# Patient Record
Sex: Female | Born: 1935 | Race: White | Hispanic: No | Marital: Married | Smoking: Never smoker
Health system: Southern US, Community
[De-identification: ages and names within clinical notes are randomized; demographics above are authoritative.]

## PROBLEM LIST (undated history)

## (undated) DIAGNOSIS — I10 Essential (primary) hypertension: Secondary | ICD-10-CM

## (undated) DIAGNOSIS — I509 Heart failure, unspecified: Secondary | ICD-10-CM

## (undated) HISTORY — PX: BACK SURGERY: SHX140

## (undated) HISTORY — PX: INSERT / REPLACE / REMOVE PACEMAKER: SUR710

## (undated) HISTORY — PX: TONSILLECTOMY: SUR1361

## (undated) HISTORY — PX: CHOLECYSTECTOMY: SHX55

## (undated) HISTORY — PX: EXCISION VAGINAL CYST: SHX5825

## (undated) HISTORY — PX: ABDOMINAL HYSTERECTOMY: SHX81

---

## 2020-03-25 ENCOUNTER — Encounter (HOSPITAL_COMMUNITY): Payer: Self-pay | Admitting: *Deleted

## 2020-03-25 ENCOUNTER — Emergency Department (HOSPITAL_COMMUNITY): Payer: Medicare Other

## 2020-03-25 ENCOUNTER — Other Ambulatory Visit: Payer: Self-pay

## 2020-03-25 ENCOUNTER — Inpatient Hospital Stay (HOSPITAL_COMMUNITY)
Admission: EM | Admit: 2020-03-25 | Discharge: 2020-03-28 | DRG: 603 | Disposition: A | Discharge status: Home Health | Payer: Medicare Other | Attending: Internal Medicine | Admitting: Internal Medicine

## 2020-03-25 DIAGNOSIS — Z7982 Long term (current) use of aspirin: Secondary | ICD-10-CM

## 2020-03-25 DIAGNOSIS — N184 Chronic kidney disease, stage 4 (severe): Secondary | ICD-10-CM | POA: Diagnosis present

## 2020-03-25 DIAGNOSIS — L02511 Cutaneous abscess of right hand: Secondary | ICD-10-CM | POA: Diagnosis present

## 2020-03-25 DIAGNOSIS — Z66 Do not resuscitate: Secondary | ICD-10-CM | POA: Diagnosis present

## 2020-03-25 DIAGNOSIS — R55 Syncope and collapse: Secondary | ICD-10-CM | POA: Diagnosis present

## 2020-03-25 DIAGNOSIS — W19XXXA Unspecified fall, initial encounter: Secondary | ICD-10-CM | POA: Diagnosis present

## 2020-03-25 DIAGNOSIS — I10 Essential (primary) hypertension: Secondary | ICD-10-CM

## 2020-03-25 DIAGNOSIS — I130 Hypertensive heart and chronic kidney disease with heart failure and stage 1 through stage 4 chronic kidney disease, or unspecified chronic kidney disease: Secondary | ICD-10-CM | POA: Diagnosis present

## 2020-03-25 DIAGNOSIS — Z950 Presence of cardiac pacemaker: Secondary | ICD-10-CM

## 2020-03-25 DIAGNOSIS — Z79890 Hormone replacement therapy (postmenopausal): Secondary | ICD-10-CM

## 2020-03-25 DIAGNOSIS — Y92009 Unspecified place in unspecified non-institutional (private) residence as the place of occurrence of the external cause: Secondary | ICD-10-CM

## 2020-03-25 DIAGNOSIS — L03119 Cellulitis of unspecified part of limb: Secondary | ICD-10-CM | POA: Diagnosis present

## 2020-03-25 DIAGNOSIS — R531 Weakness: Secondary | ICD-10-CM | POA: Diagnosis present

## 2020-03-25 DIAGNOSIS — L02519 Cutaneous abscess of unspecified hand: Secondary | ICD-10-CM | POA: Diagnosis present

## 2020-03-25 DIAGNOSIS — M659 Synovitis and tenosynovitis, unspecified: Secondary | ICD-10-CM | POA: Diagnosis present

## 2020-03-25 DIAGNOSIS — I509 Heart failure, unspecified: Secondary | ICD-10-CM | POA: Diagnosis present

## 2020-03-25 DIAGNOSIS — E782 Mixed hyperlipidemia: Secondary | ICD-10-CM | POA: Diagnosis present

## 2020-03-25 DIAGNOSIS — N289 Disorder of kidney and ureter, unspecified: Secondary | ICD-10-CM

## 2020-03-25 DIAGNOSIS — Z809 Family history of malignant neoplasm, unspecified: Secondary | ICD-10-CM

## 2020-03-25 DIAGNOSIS — Z833 Family history of diabetes mellitus: Secondary | ICD-10-CM

## 2020-03-25 DIAGNOSIS — Z7952 Long term (current) use of systemic steroids: Secondary | ICD-10-CM

## 2020-03-25 DIAGNOSIS — I480 Paroxysmal atrial fibrillation: Secondary | ICD-10-CM | POA: Diagnosis present

## 2020-03-25 DIAGNOSIS — I5032 Chronic diastolic (congestive) heart failure: Secondary | ICD-10-CM | POA: Diagnosis present

## 2020-03-25 DIAGNOSIS — L03011 Cellulitis of right finger: Secondary | ICD-10-CM | POA: Diagnosis not present

## 2020-03-25 DIAGNOSIS — E039 Hypothyroidism, unspecified: Secondary | ICD-10-CM | POA: Diagnosis present

## 2020-03-25 DIAGNOSIS — Z888 Allergy status to other drugs, medicaments and biological substances status: Secondary | ICD-10-CM

## 2020-03-25 DIAGNOSIS — Z882 Allergy status to sulfonamides status: Secondary | ICD-10-CM

## 2020-03-25 DIAGNOSIS — I776 Arteritis, unspecified: Secondary | ICD-10-CM

## 2020-03-25 DIAGNOSIS — R42 Dizziness and giddiness: Secondary | ICD-10-CM | POA: Diagnosis present

## 2020-03-25 DIAGNOSIS — Z20822 Contact with and (suspected) exposure to covid-19: Secondary | ICD-10-CM | POA: Diagnosis present

## 2020-03-25 DIAGNOSIS — S0990XA Unspecified injury of head, initial encounter: Secondary | ICD-10-CM | POA: Diagnosis present

## 2020-03-25 HISTORY — DX: Heart failure, unspecified: I50.9

## 2020-03-25 HISTORY — DX: Essential (primary) hypertension: I10

## 2020-03-25 LAB — COMPREHENSIVE METABOLIC PANEL
ALT: 19 U/L (ref 0–44)
AST: 28 U/L (ref 15–41)
Albumin: 3.4 g/dL — ABNORMAL LOW (ref 3.5–5.0)
Alkaline Phosphatase: 75 U/L (ref 38–126)
Anion gap: 15 (ref 5–15)
BUN: 58 mg/dL — ABNORMAL HIGH (ref 8–23)
CO2: 21 mmol/L — ABNORMAL LOW (ref 22–32)
Calcium: 11.3 mg/dL — ABNORMAL HIGH (ref 8.9–10.3)
Chloride: 99 mmol/L (ref 98–111)
Creatinine, Ser: 2.06 mg/dL — ABNORMAL HIGH (ref 0.44–1.00)
GFR, Estimated: 23 mL/min — ABNORMAL LOW (ref >60)
Glucose, Bld: 104 mg/dL — ABNORMAL HIGH (ref 70–99)
Potassium: 4.2 mmol/L (ref 3.5–5.1)
Sodium: 135 mmol/L (ref 135–145)
Total Bilirubin: 0.7 mg/dL (ref 0.3–1.2)
Total Protein: 7.1 g/dL (ref 6.5–8.1)

## 2020-03-25 LAB — URINALYSIS, ROUTINE W REFLEX MICROSCOPIC
Bacteria, UA: NONE SEEN
Bilirubin Urine: NEGATIVE
Glucose, UA: NEGATIVE mg/dL
Hgb urine dipstick: NEGATIVE
Ketones, ur: NEGATIVE mg/dL
Nitrite: NEGATIVE
Protein, ur: NEGATIVE mg/dL
Specific Gravity, Urine: 1.016 (ref 1.005–1.030)
pH: 6 (ref 5.0–8.0)

## 2020-03-25 LAB — CBC WITH DIFFERENTIAL/PLATELET
Abs Immature Granulocytes: 0.07 10*3/uL (ref 0.00–0.07)
Basophils Absolute: 0 10*3/uL (ref 0.0–0.1)
Basophils Relative: 0 %
Eosinophils Absolute: 0.1 10*3/uL (ref 0.0–0.5)
Eosinophils Relative: 0 %
HCT: 39.8 % (ref 36.0–46.0)
Hemoglobin: 12.9 g/dL (ref 12.0–15.0)
Immature Granulocytes: 1 %
Lymphocytes Relative: 19 %
Lymphs Abs: 2.1 10*3/uL (ref 0.7–4.0)
MCH: 31 pg (ref 26.0–34.0)
MCHC: 32.4 g/dL (ref 30.0–36.0)
MCV: 95.7 fL (ref 80.0–100.0)
Monocytes Absolute: 1 10*3/uL (ref 0.1–1.0)
Monocytes Relative: 9 %
Neutro Abs: 8 10*3/uL — ABNORMAL HIGH (ref 1.7–7.7)
Neutrophils Relative %: 71 %
Platelets: 307 10*3/uL (ref 150–400)
RBC: 4.16 MIL/uL (ref 3.87–5.11)
RDW: 13.4 % (ref 11.5–15.5)
WBC: 11.2 10*3/uL — ABNORMAL HIGH (ref 4.0–10.5)
nRBC: 0 % (ref 0.0–0.2)

## 2020-03-25 LAB — LACTIC ACID, PLASMA: Lactic Acid, Venous: 1.6 mmol/L (ref 0.5–1.9)

## 2020-03-25 LAB — PROTIME-INR
INR: 1 (ref 0.8–1.2)
Prothrombin Time: 13 seconds (ref 11.4–15.2)

## 2020-03-25 NOTE — ED Provider Notes (Addendum)
[facility] EMERGENCY DEPARTMENT Provider Note   CSN: [id number] Arrival date & time: 03/25/20  2133   History Chief Complaint  Patient presents with  . index finger red swollen    Carol Brewer is a 85 y.o. female.  The history is provided by the patient.  She has history of hypertension, heart failure and comes in because of pain and swelling of her right index finger.  This has been present for about 4 or 5 days and has been getting worse.  She does not recall any kind of injury, but she is a very poor historian in general.  She denies fever or chills.  She rates pain at 7/10.  Pain is worse with movement or accidentally hitting it.  There has been no drainage.  Past Medical History:  Diagnosis Date  . CHF (congestive heart failure) ([place])   . Hypertension     There are no problems to display for this patient.   History reviewed. No pertinent surgical history.   OB History   No obstetric history on file.     No family history on file.  Social History   Tobacco Use  . Smoking status: Never Smoker  . Smokeless tobacco: Never Used  Substance Use Topics  . Alcohol use: Never  . Drug use: Not on file    Home Medications Prior to Admission medications   Not on File    Allergies    Penicillins  Review of Systems   Review of Systems  All other systems reviewed and are negative.   Physical Exam Updated Vital Signs BP (!) 142/88   Pulse 98   Temp 97.9 F (36.6 C)   Resp 17   Ht 5\' 6"  (1.676 m)   Wt 72.6 kg   SpO2 98%   BMI 25.82 kg/m   Physical Exam Vitals and nursing note reviewed.   85 year old female, resting comfortably and in no acute distress. Vital signs are significant for mildly elevated blood pressure. Oxygen saturation is 98%, which is normal. Head is normocephalic and atraumatic. PERRLA, EOMI. Oropharynx is clear. Neck is nontender and supple without adenopathy or JVD. Back is nontender and there is no CVA  tenderness. Lungs are clear without rales, wheezes, or rhonchi. Chest is nontender. Heart has regular rate and rhythm without murmur. Abdomen is soft, flat, nontender without masses or hepatosplenomegaly and peristalsis is normoactive. Extremities: Moderate erythema and swelling of the entire right index finger with some slight fluctuance noted in the distal phalanx.  There is pain with passive range of motion, but the finger is not completely stiff.  Capillary refill is prompt.  Remainder of extremity exam is significant for evidence of Heberden's nodes. Skin is warm and dry without rash. Neurologic: Mental status is normal, cranial nerves are intact, there are no motor or sensory deficits.    ED Results / Procedures / Treatments   Labs (all labs ordered are listed, but only abnormal results are displayed) Labs Reviewed  COMPREHENSIVE METABOLIC PANEL - Abnormal; Notable for the following components:      Result Value   CO2 21 (*)    Glucose, Bld 104 (*)    BUN 58 (*)    Creatinine, Ser 2.06 (*)    Calcium 11.3 (*)    Albumin 3.4 (*)    GFR, Estimated 23 (*)    All other components within normal limits  CBC WITH DIFFERENTIAL/PLATELET - Abnormal; Notable for the following components:   WBC 11.2 (*)  Neutro Abs 8.0 (*)    All other components within normal limits  URINALYSIS, ROUTINE W REFLEX MICROSCOPIC - Abnormal; Notable for the following components:   APPearance HAZY (*)    Leukocytes,Ua TRACE (*)    All other components within normal limits  CULTURE, BLOOD (ROUTINE X 2)  CULTURE, BLOOD (ROUTINE X 2)  LACTIC ACID, PLASMA  PROTIME-INR  LACTIC ACID, PLASMA   Radiology DG Chest 2 View  Result Date: 03/25/2020 CLINICAL DATA:  Suspected sepsis.  Atrial fibrillation history. EXAM: CHEST - 2 VIEW COMPARISON:  None. FINDINGS: Two lead left chest wall pacemaker. The heart size and mediastinal contours are within normal limits. 4 mm right upper lobe calcified nodule likely  represents of calcified granuloma. Suggestion of a retrocardiac opacity on the frontal view not visualized on lateral view - likely due to overlying soft tissues. No focal consolidation. No pulmonary edema. No pleural effusion. No pneumothorax. No acute osseous abnormality. Multilevel degenerative changes of the spine. L1 through L3 compression fractures with kyphoplasty at the L1 level. Surgical clips in the upper abdomen. IMPRESSION: 1. No active cardiopulmonary disease. 2. L1 through L3 compression fractures -likely chronic. Electronically Signed   By: Iven Finn M.D.   On: 03/25/2020 22:57   DG Finger Index Right  Result Date: 03/26/2020 CLINICAL DATA:  Initial evaluation for acute swelling. EXAM: RIGHT INDEX FINGER 2+V COMPARISON:  None. FINDINGS: No acute fracture dislocation. Diffuse soft tissue swelling present about the right index finger. Minimal heterotopic calcification noted adjacent to the right second middle phalanx. No radiopaque foreign body. No abnormal or dissecting soft tissue emphysema. No evidence for acute osteomyelitis. 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 IMPRESSION: 1. Diffuse soft tissue swelling about the right index finger. No evidence for acute osteomyelitis. 2. No acute fracture or dislocation. 3. 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 Electronically Signed   By: Jeannine Boga M.D.   On: 03/26/2020 01:06    Procedures Procedures   Medications Ordered in ED Medications  vancomycin (VANCOCIN) IVPB 1000 mg/200 mL premix (1,000 mg Intravenous New Bag/Given 03/26/20 0121)    ED Course  I have reviewed the triage vital signs and the nursing notes.  Pertinent labs & imaging results that were available during my care of the patient were reviewed by me and considered in my medical decision making (see chart for details).  MDM Rules/Calculators/A&P Infection of  right index finger.  I suspect this started as a paronychia but with swelling and redness of the entire finger, I am concerned about early tenosynovitis.  Patient was started on sepsis pathway at triage and lactic acid level is normal, WBC only minimally elevated without a left shift.  She does have renal insufficiency and hypercalcemia with no prior labs available for comparison.  Chest x-ray shows no evidence of pneumonia.  Old records are reviewed, and she has no relevant past visits.  Will consult hand surgery regarding possible exploration of finger.  Case has been discussed with Dr. Jeannie Fend who has reviewed the pictures and has requested an x-ray be obtained and start patient on antibiotics.  He request patient be admitted to medicine service and he will see in consultation and decide whether she need surgical drainage based on response to therapy.  Case is discussed with Dr. Cyd Silence of [facility], who agrees to admit the patient.  Finger x-ray shows no evidence of osteomyelitis.  Final Clinical Impression(s) / ED Diagnoses Final diagnoses:  Tenosynovitis of finger  Elevated blood pressure reading with diagnosis of hypertension  Renal insufficiency  Hypercalcemia    Rx / DC Orders ED Discharge Orders    None       Delora Fuel, MD 38/18/40 [id number]    Delora Fuel, MD 36/06/77 0202

## 2020-03-25 NOTE — ED Triage Notes (Signed)
The pt has had a red swollen rt indes=x finger painful for 2-3 days today the finger is red swollen and has pread to the hand she denies any injury no cut she has it bandaged

## 2020-03-26 ENCOUNTER — Observation Stay (HOSPITAL_COMMUNITY): Payer: Medicare Other

## 2020-03-26 ENCOUNTER — Encounter (HOSPITAL_COMMUNITY)
Admission: EM | Disposition: A | Discharge status: Home Health | Payer: Self-pay | Source: Home / Self Care | Attending: Internal Medicine

## 2020-03-26 ENCOUNTER — Emergency Department (HOSPITAL_COMMUNITY): Payer: Medicare Other

## 2020-03-26 ENCOUNTER — Observation Stay (HOSPITAL_COMMUNITY): Payer: Medicare Other | Admitting: Certified Registered"

## 2020-03-26 ENCOUNTER — Encounter (HOSPITAL_COMMUNITY): Payer: Self-pay | Admitting: Internal Medicine

## 2020-03-26 DIAGNOSIS — E039 Hypothyroidism, unspecified: Secondary | ICD-10-CM

## 2020-03-26 DIAGNOSIS — M659 Synovitis and tenosynovitis, unspecified: Secondary | ICD-10-CM | POA: Diagnosis present

## 2020-03-26 DIAGNOSIS — Z809 Family history of malignant neoplasm, unspecified: Secondary | ICD-10-CM | POA: Diagnosis not present

## 2020-03-26 DIAGNOSIS — Z882 Allergy status to sulfonamides status: Secondary | ICD-10-CM | POA: Diagnosis not present

## 2020-03-26 DIAGNOSIS — R55 Syncope and collapse: Secondary | ICD-10-CM

## 2020-03-26 DIAGNOSIS — I776 Arteritis, unspecified: Secondary | ICD-10-CM | POA: Diagnosis present

## 2020-03-26 DIAGNOSIS — L02511 Cutaneous abscess of right hand: Secondary | ICD-10-CM | POA: Diagnosis present

## 2020-03-26 DIAGNOSIS — Z7952 Long term (current) use of systemic steroids: Secondary | ICD-10-CM | POA: Diagnosis not present

## 2020-03-26 DIAGNOSIS — I480 Paroxysmal atrial fibrillation: Secondary | ICD-10-CM

## 2020-03-26 DIAGNOSIS — I130 Hypertensive heart and chronic kidney disease with heart failure and stage 1 through stage 4 chronic kidney disease, or unspecified chronic kidney disease: Secondary | ICD-10-CM | POA: Diagnosis present

## 2020-03-26 DIAGNOSIS — N184 Chronic kidney disease, stage 4 (severe): Secondary | ICD-10-CM | POA: Diagnosis present

## 2020-03-26 DIAGNOSIS — I10 Essential (primary) hypertension: Secondary | ICD-10-CM

## 2020-03-26 DIAGNOSIS — Z7982 Long term (current) use of aspirin: Secondary | ICD-10-CM | POA: Diagnosis not present

## 2020-03-26 DIAGNOSIS — Z20822 Contact with and (suspected) exposure to covid-19: Secondary | ICD-10-CM | POA: Diagnosis present

## 2020-03-26 DIAGNOSIS — I509 Heart failure, unspecified: Secondary | ICD-10-CM | POA: Diagnosis present

## 2020-03-26 DIAGNOSIS — R531 Weakness: Secondary | ICD-10-CM | POA: Diagnosis present

## 2020-03-26 DIAGNOSIS — Y92009 Unspecified place in unspecified non-institutional (private) residence as the place of occurrence of the external cause: Secondary | ICD-10-CM

## 2020-03-26 DIAGNOSIS — Z950 Presence of cardiac pacemaker: Secondary | ICD-10-CM

## 2020-03-26 DIAGNOSIS — Z79890 Hormone replacement therapy (postmenopausal): Secondary | ICD-10-CM | POA: Diagnosis not present

## 2020-03-26 DIAGNOSIS — E782 Mixed hyperlipidemia: Secondary | ICD-10-CM

## 2020-03-26 DIAGNOSIS — I5032 Chronic diastolic (congestive) heart failure: Secondary | ICD-10-CM | POA: Diagnosis present

## 2020-03-26 DIAGNOSIS — Z66 Do not resuscitate: Secondary | ICD-10-CM | POA: Diagnosis present

## 2020-03-26 DIAGNOSIS — Z888 Allergy status to other drugs, medicaments and biological substances status: Secondary | ICD-10-CM | POA: Diagnosis not present

## 2020-03-26 DIAGNOSIS — Z833 Family history of diabetes mellitus: Secondary | ICD-10-CM | POA: Diagnosis not present

## 2020-03-26 DIAGNOSIS — W19XXXA Unspecified fall, initial encounter: Secondary | ICD-10-CM | POA: Diagnosis present

## 2020-03-26 DIAGNOSIS — L02519 Cutaneous abscess of unspecified hand: Secondary | ICD-10-CM | POA: Diagnosis present

## 2020-03-26 DIAGNOSIS — L03011 Cellulitis of right finger: Secondary | ICD-10-CM | POA: Diagnosis present

## 2020-03-26 HISTORY — DX: Arteritis, unspecified: I77.6

## 2020-03-26 HISTORY — DX: Essential (primary) hypertension: I10

## 2020-03-26 HISTORY — DX: Presence of cardiac pacemaker: Z95.0

## 2020-03-26 HISTORY — PX: I & D EXTREMITY: SHX5045

## 2020-03-26 HISTORY — DX: Hypothyroidism, unspecified: E03.9

## 2020-03-26 HISTORY — DX: Mixed hyperlipidemia: E78.2

## 2020-03-26 LAB — COMPREHENSIVE METABOLIC PANEL
ALT: 17 U/L (ref 0–44)
AST: 23 U/L (ref 15–41)
Albumin: 2.9 g/dL — ABNORMAL LOW (ref 3.5–5.0)
Alkaline Phosphatase: 73 U/L (ref 38–126)
Anion gap: 13 (ref 5–15)
BUN: 53 mg/dL — ABNORMAL HIGH (ref 8–23)
CO2: 21 mmol/L — ABNORMAL LOW (ref 22–32)
Calcium: 10.4 mg/dL — ABNORMAL HIGH (ref 8.9–10.3)
Chloride: 101 mmol/L (ref 98–111)
Creatinine, Ser: 1.89 mg/dL — ABNORMAL HIGH (ref 0.44–1.00)
GFR, Estimated: 26 mL/min — ABNORMAL LOW (ref >60)
Glucose, Bld: 86 mg/dL (ref 70–99)
Potassium: 3.8 mmol/L (ref 3.5–5.1)
Sodium: 135 mmol/L (ref 135–145)
Total Bilirubin: 0.9 mg/dL (ref 0.3–1.2)
Total Protein: 6.2 g/dL — ABNORMAL LOW (ref 6.5–8.1)

## 2020-03-26 LAB — RESP PANEL BY RT-PCR (FLU A&B, COVID) ARPGX2
Influenza A by PCR: NEGATIVE
Influenza B by PCR: NEGATIVE
SARS Coronavirus 2 by RT PCR: NEGATIVE

## 2020-03-26 LAB — ECHOCARDIOGRAM COMPLETE
AR max vel: 3.09 cm2
AV Area VTI: 3.24 cm2
AV Area mean vel: 2.78 cm2
AV Mean grad: 4 mmHg
AV Peak grad: 5.9 mmHg
Ao pk vel: 1.21 m/s
Area-P 1/2: 2.05 cm2
Height: 66 in
P 1/2 time: 713 msec
S' Lateral: 2.6 cm
Weight: 2560 oz

## 2020-03-26 LAB — CBC WITH DIFFERENTIAL/PLATELET
Abs Immature Granulocytes: 0.05 10*3/uL (ref 0.00–0.07)
Basophils Absolute: 0 10*3/uL (ref 0.0–0.1)
Basophils Relative: 0 %
Eosinophils Absolute: 0 10*3/uL (ref 0.0–0.5)
Eosinophils Relative: 0 %
HCT: 37 % (ref 36.0–46.0)
Hemoglobin: 12.1 g/dL (ref 12.0–15.0)
Immature Granulocytes: 1 %
Lymphocytes Relative: 12 %
Lymphs Abs: 1.2 10*3/uL (ref 0.7–4.0)
MCH: 31.3 pg (ref 26.0–34.0)
MCHC: 32.7 g/dL (ref 30.0–36.0)
MCV: 95.6 fL (ref 80.0–100.0)
Monocytes Absolute: 1 10*3/uL (ref 0.1–1.0)
Monocytes Relative: 10 %
Neutro Abs: 8.1 10*3/uL — ABNORMAL HIGH (ref 1.7–7.7)
Neutrophils Relative %: 77 %
Platelets: 261 10*3/uL (ref 150–400)
RBC: 3.87 MIL/uL (ref 3.87–5.11)
RDW: 13.6 % (ref 11.5–15.5)
WBC: 10.4 10*3/uL (ref 4.0–10.5)
nRBC: 0 % (ref 0.0–0.2)

## 2020-03-26 LAB — MAGNESIUM: Magnesium: 2.1 mg/dL (ref 1.7–2.4)

## 2020-03-26 LAB — TSH: TSH: 0.804 u[IU]/mL (ref 0.350–4.500)

## 2020-03-26 LAB — TROPONIN I (HIGH SENSITIVITY): Troponin I (High Sensitivity): 45 ng/L — ABNORMAL HIGH (ref <18)

## 2020-03-26 LAB — LACTIC ACID, PLASMA: Lactic Acid, Venous: 1.3 mmol/L (ref 0.5–1.9)

## 2020-03-26 LAB — MRSA PCR SCREENING: MRSA by PCR: NEGATIVE

## 2020-03-26 SURGERY — IRRIGATION AND DEBRIDEMENT EXTREMITY
Anesthesia: General | Site: Finger | Laterality: Right

## 2020-03-26 MED ORDER — CHLORHEXIDINE GLUCONATE 0.12 % MT SOLN: 15.0000 mL | Freq: Once | OROMUCOSAL | Status: AC

## 2020-03-26 MED ORDER — LIDOCAINE HCL (CARDIAC) PF 100 MG/5ML IV SOSY
PREFILLED_SYRINGE | INTRAVENOUS | Status: DC | PRN
  Administered 2020-03-26: 20 mg via INTRAVENOUS

## 2020-03-26 MED ORDER — BUPIVACAINE HCL (PF) 0.25 % IJ SOLN
INTRAMUSCULAR | Status: AC
  Filled 2020-03-26: qty 30

## 2020-03-26 MED ORDER — ACETAMINOPHEN 325 MG PO TABS
650.0000 mg | ORAL_TABLET | Freq: Four times a day (QID) | ORAL | Status: DC | PRN
  Administered 2020-03-26 – 2020-03-28 (×4): 650 mg via ORAL
  Filled 2020-03-26 (×4): qty 2

## 2020-03-26 MED ORDER — METOCLOPRAMIDE HCL 5 MG/ML IJ SOLN: 5.0000 mg | Freq: Three times a day (TID) | INTRAMUSCULAR | Status: DC | PRN

## 2020-03-26 MED ORDER — FENTANYL CITRATE (PF) 250 MCG/5ML IJ SOLN
INTRAMUSCULAR | Status: AC
  Filled 2020-03-26: qty 5

## 2020-03-26 MED ORDER — ONDANSETRON HCL 4 MG/2ML IJ SOLN
INTRAMUSCULAR | Status: DC | PRN
  Administered 2020-03-26: 4 mg via INTRAVENOUS

## 2020-03-26 MED ORDER — CEFAZOLIN SODIUM-DEXTROSE 2-3 GM-%(50ML) IV SOLR
INTRAVENOUS | Status: DC | PRN
  Administered 2020-03-26: 2 g via INTRAVENOUS

## 2020-03-26 MED ORDER — LIDOCAINE HCL (PF) 2 % IJ SOLN
INTRAMUSCULAR | Status: AC
  Filled 2020-03-26: qty 5

## 2020-03-26 MED ORDER — SODIUM CHLORIDE 0.9 % IV SOLN: INTRAVENOUS | Status: AC

## 2020-03-26 MED ORDER — ENSURE ENLIVE PO LIQD
237.0000 mL | Freq: Two times a day (BID) | ORAL | Status: DC
  Administered 2020-03-26 – 2020-03-28 (×4): 237 mL via ORAL

## 2020-03-26 MED ORDER — DEXAMETHASONE SODIUM PHOSPHATE 4 MG/ML IJ SOLN
INTRAMUSCULAR | Status: DC | PRN
  Administered 2020-03-26: 4 mg via INTRAVENOUS

## 2020-03-26 MED ORDER — POLYETHYLENE GLYCOL 3350 17 G PO PACK: 17.0000 g | PACK | Freq: Every day | ORAL | Status: DC | PRN

## 2020-03-26 MED ORDER — HYDRALAZINE HCL 20 MG/ML IJ SOLN: 10.0000 mg | Freq: Four times a day (QID) | INTRAMUSCULAR | Status: DC | PRN

## 2020-03-26 MED ORDER — FENTANYL CITRATE (PF) 100 MCG/2ML IJ SOLN
INTRAMUSCULAR | Status: DC | PRN
  Administered 2020-03-26: 50 ug via INTRAVENOUS

## 2020-03-26 MED ORDER — ONDANSETRON HCL 4 MG/2ML IJ SOLN: 4.0000 mg | Freq: Four times a day (QID) | INTRAMUSCULAR | Status: DC | PRN

## 2020-03-26 MED ORDER — VANCOMYCIN HCL IN DEXTROSE 1-5 GM/200ML-% IV SOLN
1000.0000 mg | Freq: Once | INTRAVENOUS | Status: AC
  Administered 2020-03-26: 1000 mg via INTRAVENOUS
  Filled 2020-03-26: qty 200

## 2020-03-26 MED ORDER — PROPOFOL 10 MG/ML IV BOLUS
INTRAVENOUS | Status: AC
  Filled 2020-03-26: qty 20

## 2020-03-26 MED ORDER — LEVOTHYROXINE SODIUM 112 MCG PO TABS
112.0000 ug | ORAL_TABLET | Freq: Every day | ORAL | Status: DC
  Administered 2020-03-27 – 2020-03-28 (×2): 112 ug via ORAL
  Filled 2020-03-26 (×2): qty 1

## 2020-03-26 MED ORDER — GEMFIBROZIL 600 MG PO TABS
600.0000 mg | ORAL_TABLET | Freq: Every day | ORAL | Status: DC
  Administered 2020-03-26 – 2020-03-28 (×3): 600 mg via ORAL
  Filled 2020-03-26 (×3): qty 1

## 2020-03-26 MED ORDER — SODIUM CHLORIDE 0.9 % IR SOLN
Status: DC | PRN
  Administered 2020-03-26: 1000 mL

## 2020-03-26 MED ORDER — PREDNISONE 10 MG PO TABS
10.0000 mg | ORAL_TABLET | Freq: Every day | ORAL | Status: DC
  Administered 2020-03-26 – 2020-03-28 (×3): 10 mg via ORAL
  Filled 2020-03-26 (×3): qty 1

## 2020-03-26 MED ORDER — DOCUSATE SODIUM 100 MG PO CAPS
100.0000 mg | ORAL_CAPSULE | Freq: Two times a day (BID) | ORAL | Status: DC
  Administered 2020-03-26 – 2020-03-28 (×3): 100 mg via ORAL
  Filled 2020-03-26 (×4): qty 1

## 2020-03-26 MED ORDER — BUPIVACAINE HCL (PF) 0.25 % IJ SOLN
INTRAMUSCULAR | Status: DC | PRN
  Administered 2020-03-26: 4 mL

## 2020-03-26 MED ORDER — CHLORHEXIDINE GLUCONATE 0.12 % MT SOLN
OROMUCOSAL | Status: AC
  Administered 2020-03-26: 15 mL via OROMUCOSAL
  Filled 2020-03-26: qty 15

## 2020-03-26 MED ORDER — CEFAZOLIN SODIUM-DEXTROSE 2-4 GM/100ML-% IV SOLN
INTRAVENOUS | Status: AC
  Filled 2020-03-26: qty 100

## 2020-03-26 MED ORDER — ACETAMINOPHEN 650 MG RE SUPP: 650.0000 mg | Freq: Four times a day (QID) | RECTAL | Status: DC | PRN

## 2020-03-26 MED ORDER — OXYCODONE HCL 5 MG/5ML PO SOLN: 5.0000 mg | Freq: Once | ORAL | Status: DC | PRN

## 2020-03-26 MED ORDER — PROPOFOL 10 MG/ML IV BOLUS
INTRAVENOUS | Status: DC | PRN
  Administered 2020-03-26: 20 mg via INTRAVENOUS
  Administered 2020-03-26: 100 mg via INTRAVENOUS

## 2020-03-26 MED ORDER — METOCLOPRAMIDE HCL 5 MG PO TABS: 5.0000 mg | ORAL_TABLET | Freq: Three times a day (TID) | ORAL | Status: DC | PRN

## 2020-03-26 MED ORDER — OXYCODONE HCL 5 MG PO TABS: 5.0000 mg | ORAL_TABLET | Freq: Once | ORAL | Status: DC | PRN

## 2020-03-26 MED ORDER — AMIODARONE HCL 100 MG PO TABS
50.0000 mg | ORAL_TABLET | Freq: Every day | ORAL | Status: DC
  Administered 2020-03-26 – 2020-03-28 (×3): 50 mg via ORAL
  Filled 2020-03-26 (×3): qty 1

## 2020-03-26 MED ORDER — SODIUM CHLORIDE 0.9 % IV SOLN: INTRAVENOUS | Status: DC

## 2020-03-26 MED ORDER — AMLODIPINE BESYLATE 5 MG PO TABS
5.0000 mg | ORAL_TABLET | Freq: Every day | ORAL | Status: DC
  Administered 2020-03-26 – 2020-03-28 (×3): 5 mg via ORAL
  Filled 2020-03-26 (×3): qty 1

## 2020-03-26 MED ORDER — PHENYLEPHRINE HCL (PRESSORS) 10 MG/ML IV SOLN
INTRAVENOUS | Status: DC | PRN
  Administered 2020-03-26: 80 ug via INTRAVENOUS

## 2020-03-26 MED ORDER — FENTANYL CITRATE (PF) 100 MCG/2ML IJ SOLN: 25.0000 ug | INTRAMUSCULAR | Status: DC | PRN

## 2020-03-26 MED ORDER — VANCOMYCIN HCL 750 MG/150ML IV SOLN
750.0000 mg | INTRAVENOUS | Status: DC
  Administered 2020-03-26: 750 mg via INTRAVENOUS
  Filled 2020-03-26: qty 150

## 2020-03-26 MED ORDER — ORAL CARE MOUTH RINSE: 15.0000 mL | Freq: Once | OROMUCOSAL | Status: AC

## 2020-03-26 MED ORDER — ONDANSETRON HCL 4 MG PO TABS: 4.0000 mg | ORAL_TABLET | Freq: Four times a day (QID) | ORAL | Status: DC | PRN

## 2020-03-26 SURGICAL SUPPLY — 65 items
ALCOHOL 70% 16 OZ (MISCELLANEOUS) ×2 IMPLANT
BANDAGE CONFORM 2  STR LF (GAUZE/BANDAGES/DRESSINGS) ×2 IMPLANT
BLADE SURG 10 STRL SS (BLADE) ×2 IMPLANT
BNDG COHESIVE 1X5 TAN STRL LF (GAUZE/BANDAGES/DRESSINGS) ×2 IMPLANT
BNDG COHESIVE 4X5 TAN STRL (GAUZE/BANDAGES/DRESSINGS) ×2 IMPLANT
BNDG CONFORM 3 STRL LF (GAUZE/BANDAGES/DRESSINGS) IMPLANT
BNDG ELASTIC 3X5.8 VLCR STR LF (GAUZE/BANDAGES/DRESSINGS) IMPLANT
BNDG GAUZE ELAST 4 BULKY (GAUZE/BANDAGES/DRESSINGS) ×6 IMPLANT
CORD BIPOLAR FORCEPS 12FT (ELECTRODE) IMPLANT
COVER SURGICAL LIGHT HANDLE (MISCELLANEOUS) ×2 IMPLANT
COVER WAND RF STERILE (DRAPES) ×2 IMPLANT
CUFF TOURN SGL QUICK 24 (TOURNIQUET CUFF)
CUFF TOURN SGL QUICK 34 (TOURNIQUET CUFF) ×2
CUFF TOURN SGL QUICK 42 (TOURNIQUET CUFF) IMPLANT
CUFF TRNQT CYL 24X4X16.5-23 (TOURNIQUET CUFF) IMPLANT
CUFF TRNQT CYL 34X4.125X (TOURNIQUET CUFF) ×2 IMPLANT
DRAPE IMP U-DRAPE 54X76 (DRAPES) IMPLANT
DRAPE SURG 17X23 STRL (DRAPES) IMPLANT
DRAPE U-SHAPE 47X51 STRL (DRAPES) ×2 IMPLANT
DRSG PAD ABDOMINAL 8X10 ST (GAUZE/BANDAGES/DRESSINGS) ×2 IMPLANT
DURAPREP 26ML APPLICATOR (WOUND CARE) ×2 IMPLANT
ELECT CAUTERY BLADE 6.4 (BLADE) ×2 IMPLANT
ELECT REM PT RETURN 9FT ADLT (ELECTROSURGICAL)
ELECTRODE REM PT RTRN 9FT ADLT (ELECTROSURGICAL) IMPLANT
FACESHIELD WRAPAROUND (MASK) IMPLANT
GAUZE PACKING IODOFORM 1/4X15 (PACKING) ×2 IMPLANT
GAUZE SPONGE 4X4 12PLY STRL (GAUZE/BANDAGES/DRESSINGS) ×2 IMPLANT
GAUZE XEROFORM 1X8 LF (GAUZE/BANDAGES/DRESSINGS) ×2 IMPLANT
GAUZE XEROFORM 5X9 LF (GAUZE/BANDAGES/DRESSINGS) ×2 IMPLANT
GLOVE BIO SURGEON STRL SZ7.5 (GLOVE) ×4 IMPLANT
GLOVE BIOGEL PI IND STRL 8 (GLOVE) ×2 IMPLANT
GLOVE BIOGEL PI INDICATOR 8 (GLOVE) ×2
GOWN STRL REUS W/ TWL LRG LVL3 (GOWN DISPOSABLE) ×2 IMPLANT
GOWN STRL REUS W/ TWL XL LVL3 (GOWN DISPOSABLE) ×2 IMPLANT
GOWN STRL REUS W/TWL LRG LVL3 (GOWN DISPOSABLE) ×2
GOWN STRL REUS W/TWL XL LVL3 (GOWN DISPOSABLE) ×2
HANDPIECE INTERPULSE COAX TIP (DISPOSABLE)
KIT BASIN OR (CUSTOM PROCEDURE TRAY) ×2 IMPLANT
KIT TURNOVER KIT B (KITS) ×2 IMPLANT
MANIFOLD NEPTUNE II (INSTRUMENTS) ×2 IMPLANT
NS IRRIG 1000ML POUR BTL (IV SOLUTION) ×2 IMPLANT
PACK ORTHO EXTREMITY (CUSTOM PROCEDURE TRAY) ×2 IMPLANT
PAD ARMBOARD 7.5X6 YLW CONV (MISCELLANEOUS) ×4 IMPLANT
PADDING CAST ABS 4INX4YD NS (CAST SUPPLIES) ×2
PADDING CAST ABS COTTON 4X4 ST (CAST SUPPLIES) ×2 IMPLANT
PADDING CAST COTTON 6X4 STRL (CAST SUPPLIES) ×2 IMPLANT
SET CYSTO W/LG BORE CLAMP LF (SET/KITS/TRAYS/PACK) ×2 IMPLANT
SET HNDPC FAN SPRY TIP SCT (DISPOSABLE) IMPLANT
SPONGE LAP 18X18 RF (DISPOSABLE) ×4 IMPLANT
STOCKINETTE IMPERVIOUS 9X36 MD (GAUZE/BANDAGES/DRESSINGS) ×2 IMPLANT
SUT ETHILON 2 0 FS 18 (SUTURE) IMPLANT
SUT ETHILON 2 0 PSLX (SUTURE) IMPLANT
SUT ETHILON 3 0 PS 1 (SUTURE) IMPLANT
SUT VIC AB 2-0 CT1 36 (SUTURE) IMPLANT
SUT VIC AB 2-0 FS1 27 (SUTURE) IMPLANT
SWAB COLLECTION DEVICE MRSA (MISCELLANEOUS) ×2 IMPLANT
SWAB CULTURE ESWAB REG 1ML (MISCELLANEOUS) ×2 IMPLANT
SYR CONTROL 10ML LL (SYRINGE) IMPLANT
TOWEL GREEN STERILE (TOWEL DISPOSABLE) ×2 IMPLANT
TOWEL GREEN STERILE FF (TOWEL DISPOSABLE) ×2 IMPLANT
TUBE CONNECTING 12X1/4 (SUCTIONS) ×2 IMPLANT
TUBE FEEDING ENTERAL 5FR 16IN (TUBING) IMPLANT
UNDERPAD 30X36 HEAVY ABSORB (UNDERPADS AND DIAPERS) ×4 IMPLANT
WATER STERILE IRR 1000ML POUR (IV SOLUTION) ×2 IMPLANT
YANKAUER SUCT BULB TIP NO VENT (SUCTIONS) ×2 IMPLANT

## 2020-03-26 NOTE — Consult Note (Signed)
ORTHOPAEDIC CONSULTATION  REQUESTING PHYSICIAN: Swayze, Ava, DO  PCP:  Jacqualine Code, DO  Chief Complaint: right index finger infection  HPI: Carol Brewer is a 85 y.o. female who complains of right hand index finger swelling and increasing pain with redness.  She is right-hand dominant.  She is also independent with activities of daily living.  She endorses about 5 days of gradually increasing swelling and redness and pain.  She was evaluated in the emergency department and found to have signs concerning for progression of a soft tissue infection of the finger.  X-rays were normal.  She does have obvious cellulitis and likely felon on exam.  Hand surgery was consulted for treatment options.  She is had IV vancomycin at this point.  She denies numbness or tingling.  Past Medical History:  Diagnosis Date  . CHF (congestive heart failure) ([place])   . Essential hypertension 03/26/2020  . History of pacemaker 03/26/2020  . Hypertension   . Hypothyroidism 03/26/2020  . Mixed hyperlipidemia 03/26/2020  . Vasculitis ([place]) 03/26/2020   Past Surgical History:  Procedure Laterality Date  . ABDOMINAL HYSTERECTOMY    . BACK SURGERY    . CHOLECYSTECTOMY    . EXCISION VAGINAL CYST    . INSERT / REPLACE / REMOVE PACEMAKER    . TONSILLECTOMY     Social History   Socioeconomic History  . Marital status: Married    Spouse name: Not on file  . Number of children: Not on file  . Years of education: Not on file  . Highest education level: Not on file  Occupational History  . Not on file  Tobacco Use  . Smoking status: Never Smoker  . Smokeless tobacco: Never Used  Vaping Use  . Vaping Use: Never used  Substance and Sexual Activity  . Alcohol use: Never  . Drug use: Not on file  . Sexual activity: Not on file  Other Topics Concern  . Not on file  Social History Narrative  . Not on file   Social Determinants of Health   Financial Resource Strain:   . Difficulty of Paying  Living Expenses: Not on file  Food Insecurity:   . Worried About Charity fundraiser in the Last Year: Not on file  . Ran Out of Food in the Last Year: Not on file  Transportation Needs:   . Lack of Transportation (Medical): Not on file  . Lack of Transportation (Non-Medical): Not on file  Physical Activity:   . Days of Exercise per Week: Not on file  . Minutes of Exercise per Session: Not on file  Stress:   . Feeling of Stress : Not on file  Social Connections:   . Frequency of Communication with Friends and Family: Not on file  . Frequency of Social Gatherings with Friends and Family: Not on file  . Attends Religious Services: Not on file  . Active Member of Clubs or Organizations: Not on file  . Attends Archivist Meetings: Not on file  . Marital Status: Not on file   Family History  Problem Relation Age of Onset  . Diabetes Mother   . Bone cancer Father    Allergies  Allergen Reactions  . Lisinopril Swelling  . Sulfa Antibiotics Hives   Prior to Admission medications   Medication Sig Start Date End Date Taking? Authorizing Provider  amiodarone (PACERONE) 200 MG tablet Take 100 mg by mouth daily. 03/21/20  Yes [provider]  amLODipine (NORVASC) 5  MG tablet Take 5 mg by mouth daily. 01/19/20  Yes [provider]  aspirin 325 MG tablet Take 162.5 mg by mouth daily.    Yes [provider]  cetirizine (ZYRTEC) 10 MG tablet Take 10 mg by mouth daily as needed for allergies.   Yes [provider]  gemfibrozil (LOPID) 600 MG tablet Take 600 mg by mouth at bedtime. 01/18/20  Yes [provider]  KRILL OIL PO Take 1 Dose by mouth daily.   Yes [provider]  levothyroxine (SYNTHROID) 112 MCG tablet Take 112 mcg by mouth daily. 12/31/19  Yes [provider]  Magnesium Oxide 500 MG TABS Take 500 mg by mouth at bedtime. 01/01/20  Yes [provider]  Multiple Vitamin (MULTIVITAMIN) tablet Take 1 tablet by  mouth daily.   Yes [provider]  Omega-3 Fatty Acids (FISH OIL PO) Take 1 Dose by mouth daily.   Yes [provider]  predniSONE (DELTASONE) 10 MG tablet Take 10 mg by mouth every evening. 12/23/19  Yes [provider]  spironolactone-hydrochlorothiazide (ALDACTAZIDE) 25-25 MG tablet Take 1 tablet by mouth daily. 02/08/20  Yes [provider]   DG Chest 2 View  Result Date: 03/25/2020 CLINICAL DATA:  Suspected sepsis.  Atrial fibrillation history. EXAM: CHEST - 2 VIEW COMPARISON:  None. FINDINGS: Two lead left chest wall pacemaker. The heart size and mediastinal contours are within normal limits. 4 mm right upper lobe calcified nodule likely represents of calcified granuloma. Suggestion of a retrocardiac opacity on the frontal view not visualized on lateral view - likely due to overlying soft tissues. No focal consolidation. No pulmonary edema. No pleural effusion. No pneumothorax. No acute osseous abnormality. Multilevel degenerative changes of the spine. L1 through L3 compression fractures with kyphoplasty at the L1 level. Surgical clips in the upper abdomen. IMPRESSION: 1. No active cardiopulmonary disease. 2. L1 through L3 compression fractures -likely chronic. Electronically Signed   By: Iven Finn M.D.   On: 03/25/2020 22:57   CT HEAD WO CONTRAST  Result Date: 03/26/2020 CLINICAL DATA:  Head trauma EXAM: CT HEAD WITHOUT CONTRAST TECHNIQUE: Contiguous axial images were obtained from the base of the skull through the vertex without intravenous contrast. COMPARISON:  None. FINDINGS: Brain: No evidence of acute infarction, hemorrhage, hydrocephalus, extra-axial collection or mass lesion/mass effect. Cerebral atrophy. Low-attenuation changes in the deep white matter consistent small vessel ischemia. Vascular: Moderate intracranial vascular calcifications. Skull: Calvarium appears intact. No acute depressed skull fractures. Sinuses/Orbits: Mucosal thickening  throughout the paranasal sinuses. Near complete opacification of the sphenoid sinus. No acute air-fluid levels. Mastoid air cells are clear. Other: None. IMPRESSION: 1. No acute intracranial abnormalities. 2. Chronic atrophy and small vessel ischemia. 3. Chronic inflammatory changes of the paranasal sinuses. Electronically Signed   By: Lucienne Capers M.D.   On: 03/26/2020 03:47   DG Finger Index Right  Result Date: 03/26/2020 CLINICAL DATA:  Initial evaluation for acute swelling. EXAM: RIGHT INDEX FINGER 2+V COMPARISON:  None. FINDINGS: No acute fracture dislocation. Diffuse soft tissue swelling present about the right index finger. Minimal heterotopic calcification noted adjacent to the right second middle phalanx. No radiopaque foreign body. No abnormal or dissecting soft tissue emphysema. No evidence for acute osteomyelitis. 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 IMPRESSION: 1. Diffuse soft tissue swelling about the right index finger. No evidence for acute osteomyelitis. 2. No acute fracture or dislocation. 3. Moderate osteoarthritic changes about the right index finger and visualized  hand. Electronically Signed   By: Jeannine Boga M.D.   On: 03/26/2020 01:06   ECHOCARDIOGRAM COMPLETE  Result Date: 03/26/2020    ECHOCARDIOGRAM REPORT   Patient Name:   SAMMY DOUTHITT Date of Exam: 03/26/2020 Medical Rec #:  [id number]     Height:       66.0 in Accession #:    [id number]    Weight:       160.0 lb Date of Birth:  1934/12/09     BSA:          1.819 m Patient Age:    12 years      BP:           123/65 mmHg Patient Gender: F             HR:           71 bpm. Exam Location:  Inpatient Procedure: 2D Echo, Cardiac Doppler and Color Doppler Indications:    Syncope  History:        Patient has no prior history of Echocardiogram examinations.                 Pacemaker, Arrythmias:Atrial Fibrillation; Risk                 Factors:Hypertension and  Dyslipidemia.  Sonographer:    Clayton Lefort RDCS (AE) Referring Phys: [id number] Sherryll Burger [facility]  Sonographer Comments: Technically challenging study due to limited acoustic windows, suboptimal parasternal window, suboptimal apical window and suboptimal subcostal window. IMPRESSIONS  1. Left ventricular ejection fraction, by estimation, is 60 to 65%. The left ventricle has normal function. The left ventricle has no regional wall motion abnormalities. There is mild left ventricular hypertrophy of the basal-septal segment. Left ventricular diastolic parameters are consistent with Grade I diastolic dysfunction (impaired relaxation).  2. Right ventricular systolic function is normal. The right ventricular size is normal.  3. The mitral valve is normal in structure. No evidence of mitral valve regurgitation.  4. The aortic valve is tricuspid. Aortic valve regurgitation is trivial. Mild aortic valve sclerosis is present, with no evidence of aortic valve stenosis.  5. There is borderline dilatation of the ascending aorta, measuring 37 mm. There is Moderate (Grade III) protruding plaque involving the transverse aorta.  6. The inferior vena cava is normal in size with greater than 50% respiratory variability, suggesting right atrial pressure of 3 mmHg. FINDINGS  Left Ventricle: Left ventricular ejection fraction, by estimation, is 60 to 65%. The left ventricle has normal function. The left ventricle has no regional wall motion abnormalities. The left ventricular internal cavity size was normal in size. There is  mild left ventricular hypertrophy of the basal-septal segment. Left ventricular diastolic parameters are consistent with Grade I diastolic dysfunction (impaired relaxation). Normal left ventricular filling pressure. Right Ventricle: The right ventricular size is normal. No increase in right ventricular wall thickness. Right ventricular systolic function is normal. Left Atrium: Left atrial size was normal in size. Right  Atrium: Right atrial size was normal in size. Pericardium: Trivial pericardial effusion is present. Mitral Valve: The mitral valve is normal in structure. No evidence of mitral valve regurgitation. Tricuspid Valve: The tricuspid valve is normal in structure. Tricuspid valve regurgitation is trivial. Aortic Valve: The aortic valve is tricuspid. Aortic valve regurgitation is trivial. Aortic regurgitation PHT measures 713 msec. Mild aortic valve sclerosis is present, with no evidence of aortic valve stenosis. Aortic valve mean gradient measures 4.0 mmHg. Aortic valve peak gradient measures 5.9 mmHg.  Aortic valve area, by VTI measures 3.24 cm. Pulmonic Valve: The pulmonic valve was grossly normal. Pulmonic valve regurgitation is not visualized. Aorta: The aortic root is normal in size and structure. There is borderline dilatation of the ascending aorta, measuring 37 mm. There is moderate (Grade III) protruding plaque involving the transverse aorta. Venous: The inferior vena cava is normal in size with greater than 50% respiratory variability, suggesting right atrial pressure of 3 mmHg. IAS/Shunts: No atrial level shunt detected by color flow Doppler. Additional Comments: A pacer wire is visualized.  LEFT VENTRICLE PLAX 2D LVIDd:         3.90 cm  Diastology LVIDs:         2.60 cm  LV e' medial:    4.00 cm/s LV PW:         1.15 cm  LV E/e' medial:  8.8 LV IVS:        1.50 cm  LV e' lateral:   7.00 cm/s LVOT diam:     2.00 cm  LV E/e' lateral: 5.0 LV SV:         63 LV SV Index:   34 LVOT Area:     3.14 cm  RIGHT VENTRICLE RV S prime:     12.60 cm/s TAPSE (M-mode): 1.6 cm LEFT ATRIUM             Index       RIGHT ATRIUM           Index LA diam:        3.20 cm 1.76 cm/m  RA Area:     11.10 cm LA Vol (A2C):   38.6 ml 21.22 ml/m RA Volume:   21.50 ml  11.82 ml/m LA Vol (A4C):   36.2 ml 19.90 ml/m LA Biplane Vol: 39.8 ml 21.88 ml/m  AORTIC VALVE AV Area (Vmax):    3.09 cm AV Area (Vmean):   2.78 cm AV Area (VTI):      3.24 cm AV Vmax:           121.00 cm/s AV Vmean:          90.400 cm/s AV VTI:            0.193 m AV Peak Grad:      5.9 mmHg AV Mean Grad:      4.0 mmHg LVOT Vmax:         119.00 cm/s LVOT Vmean:        80.000 cm/s LVOT VTI:          0.199 m LVOT/AV VTI ratio: 1.03 AI PHT:            713 msec  AORTA Ao Root diam: 3.00 cm Ao Asc diam:  3.70 cm MITRAL VALVE               TRICUSPID VALVE MV Area (PHT): 2.05 cm    TR Peak grad:   17.1 mmHg MV Decel Time: 370 msec    TR Vmax:        207.00 cm/s MV E velocity: 35.20 cm/s MV A velocity: 72.84 cm/s  SHUNTS MV E/A ratio:  0.48        Systemic VTI:  0.20 m                            Systemic Diam: 2.00 cm Dani Gobble Croitoru MD Electronically signed by Sanda Klein MD Signature Date/Time: 03/26/2020/10:45:13 AM    Final  Positive ROS: All other systems have been reviewed and were otherwise negative with the exception of those mentioned in the HPI and as above.  Physical Exam: General: Alert, no acute distress Cardiovascular: No pedal edema Respiratory: No cyanosis, no use of accessory musculature GI: No organomegaly, abdomen is soft and non-tender Skin: No lesions in the area of chief complaint Neurologic: Sensation intact distally Psychiatric: Patient is competent for consent with normal mood and affect Lymphatic: No axillary or cervical lymphadenopathy  MUSCULOSKELETAL:  Right hand demonstrates dorsal erythema to the MP joint along the index finger.  She also has an obvious felon of the DIP on the radial side.  This does seem to track just slightly proximal to the DIP joint.  Otherwise no tenderness along the flexor sheath itself or at the A1 pulley.  She has no pain along the flexor side with passive extension or active flexion.  Assessment: Septic felon right hand index finger  Plan: -Due to the progressive nature of this finger infection our recommendation at this time is to go ahead with semiurgent open debridement and irrigation for this septic  felon of the right hand index finger.  -We will send some intraoperative cultures and have her return back to the hospitalist service postoperatively for continuation of IV antibiotics for at least 24 hours.  -Carol Brewer and I reviewed the risk and benefits of the procedure including but not limited to bleeding, infection, damage to surrounding nerves and vessels, persistence of infection, need for further surgery, and stiffness.  She has provided informed consent.    Nicholes Stairs, MD Cell [phone]    03/26/2020 11:14 AM

## 2020-03-26 NOTE — Progress Notes (Signed)
PROGRESS NOTE  Carol Brewer YTK:[id number] DOB: 1934-08-20 DOA: 03/25/2020 PCP: Jacqualine Code, DO  Brief History   85 year old female with past medical history of paroxysmal atrial fibrillation status post pacemaker placement (2013), hypothyroidism, hyperlipidemia, hypertension who presents to [facility] emergency department with swelling and pain of the right index finger.  Patient explains that approximately 5 days ago she began to develop redness and swelling of the right index finger.  Initially this was mild in intensity but over the next several days became more and more severe.  This is associated with severe pain at the finger, worse with flexion of the finger or accidentally striking it on surfaces.  Denies any associated nausea, vomiting, fever, change in appetite or generalized weakness.  As the days progressed pain swelling and redness continued to worsen.  Eventually decided to present to [facility] emergency department for evaluation.  Upon further questioning, patient also reports a 1 week history of episodic lightheadedness upon rising from a seated position.  This past Wednesday, patient also reports an episode of brief loss of consciousness.  Denies tongue biting self urination or self defecation.  Son denies any seizure activity.  Upon evaluation at [facility] emergency department, she was found to have substantial cellulitis of the index severe of the right hand.  Patient has been initiated on intravenous vancomycin.  X-ray of the right index finger reveals no evidence of osteomyelitis.  The hospitalist group has been called to assess the patient for admission to the hospital.  Dr. Stann Mainland was consulted for hand surgery. The patient went to the OR for open debridement and irrigation of the index finger of the right hand.   Consultants   Orthopedic surgery  Procedures   Open debridement and irrigation of the right index  finger.  Antibiotics   Anti-infectives (From admission, onward)   Start     Dose/Rate Route Frequency Ordered Stop   03/27/20 0000  vancomycin (VANCOREADY) IVPB 750 mg/150 mL        750 mg 150 mL/hr over 60 Minutes Intravenous Every 24 hours 03/26/20 0248     03/26/20 1119  ceFAZolin (ANCEF) 2-4 GM/100ML-% IVPB       Note to Pharmacy: Bobbie Stack   : cabinet override      03/26/20 1119 03/26/20 2329   03/26/20 0045  vancomycin (VANCOCIN) IVPB 1000 mg/200 mL premix        1,000 mg 200 mL/hr over 60 Minutes Intravenous  Once 03/26/20 0035 03/26/20 0250       Subjective  The patient is sitting up at bedside. No new complaints.  Objective   Vitals:  Vitals:   03/26/20 1240 03/26/20 1347  BP: (!) 112/56 (!) 144/76  Pulse:  68  Resp:    Temp: 98.3 F (36.8 C)   SpO2:  90%   Exam:  Constitutional:   The patient is awake, alert, and oriented x 3. No acute distress. Respiratory:   No increased work of breathing.  No wheezes, rales, or rhonchi  No tactile fremitus Cardiovascular:   Regular rate and rhythm  No murmurs, ectopy, or gallups.  No lateral PMI. No thrills. Abdomen:   Abdomen is soft, non-tender, non-distended  No hernias, masses, or organomegaly  Normoactive bowel sounds.  Musculoskeletal:   No cyanosis, clubbing, or edema  Right index finger is spinted and bandaged. Skin:   No rashes, lesions, ulcers  palpation of skin: no induration or nodules Neurologic:   CN 2-12 intact  Sensation all 4 extremities intact Psychiatric:   Mental status o Mood, affect appropriate o Orientation to person, place, time   judgment and insight appear intact   I have personally reviewed the following:   Today's Data   Vitals, CMP, CBC  Micro Data   Blood cultures x 2: No growht  Wound Culture: Pending  Imaging   X-ray of the right hand.  Scheduled Meds:  amiodarone  50 mg Oral Daily   amLODipine  5 mg Oral Daily   docusate sodium   100 mg Oral BID   feeding supplement  237 mL Oral BID BM   gemfibrozil  600 mg Oral Daily   levothyroxine  112 mcg Oral Q0600   predniSONE  10 mg Oral Q breakfast   Continuous Infusions:  sodium chloride 100 mL/hr at 03/26/20 0338   ceFAZolin     [START ON 03/27/2020] vancomycin      Principal Problem:   Cellulitis of index finger, right Active Problems:   AF (paroxysmal atrial fibrillation) (HCC)   Essential hypertension   Hypothyroidism   Mixed hyperlipidemia   Vasculitis ([place])   Syncope   Fall at home, initial encounter   Cellulitis and abscess of hand   LOS: 0 days   A & P  Cellulitis of index finger, right: Pt is s/p open debridement and irrigation of the right index finger by orthopedic surgery. She is receiving IV Vancomycin. Will dc vanc and replace with ancef. Blood cultures have had no growth. No osteomyelitis on x-ray.   Syncope: Patient reports 1 week history of what sounds to be orthostatic lightheadedness in addition to one episode of likely syncope this past Wednesday.Patient reports striking her head as she fell.  CT head negative. Pt will be monitored on telemetry. EKG demonstrates age-indeterminate septal infarct. Troponin is elevated. Likely due to elevated creatinine. Echocardiogram is pending. HCTZ and spironolactone are held. Will also check orthostatics.   Fall at home, initial encounter: Frequent bouts of lightheadedness at home over the past week with one episode of what sounds to be syncope last Wednesday. Obtaining CT imaging of the head due to patient reporting striking her head as she fell. Obtaining PT evaluation.  AF (paroxysmal atrial fibrillation) ([place]): Continue home regimen of low-dose amiodarone. Patient is currently rate controlled and in normal sinus rhythm. Continue to monitor patient on telemetry.  Essential hypertension: Continue home regimen of amlodipine. Temporarily holding diuretics due to reported bouts of lightheadedness and  syncope. As needed intravenous hydralazine for markedly high blood pressures.  Hypothyroidism: Continue home regimen of Synthroid  Vasculitis ([place]): Patient reports longstanding history of "inflammation of the blood vessels" but is unable to give the diagnosis. Continue home regimen of prednisone 10 mg daily. Patient recently moved here within the past year, no further records concerning this available.  Mixed hyperlipidemia: Continue home regimen of gemfibrozil  Code Status:  DNR Family Communication: None at bedside. Status is: Observation The patient remains OBS appropriate and will d/c before 2 midnights. Dispo: The patient is from: Home  Anticipated d/c is to: Home  Anticipated d/c date is: 2 days  Patient currently is not medically stable to d/c.  Megha Agnes, DO [facility] Direct contact: see [email or web address]  7PM-7AM contact night coverage as above 03/26/2020, 4:30 PM  LOS: 0 days

## 2020-03-26 NOTE — Brief Op Note (Signed)
03/26/2020  12:02 PM  PATIENT:  Carol Brewer  85 y.o. female  PRE-OPERATIVE DIAGNOSIS:  RIGHT INDEX FINGER INFECTION  POST-OPERATIVE DIAGNOSIS:  Same  PROCEDURE:  IRRIGATION AND DEBRIDEMENT, INDEX FINGER  SURGEON:  Nicholes Stairs, MD  ANESTHESIA:   General  COMPLICATIONS: None

## 2020-03-26 NOTE — Anesthesia Preprocedure Evaluation (Addendum)
Anesthesia Evaluation  Patient identified by MRN, date of birth, ID band Patient awake    Reviewed: Allergy & Precautions, NPO status , Patient's Chart, lab work & pertinent test results  History of Anesthesia Complications Negative for: history of anesthetic complications  Airway Mallampati: I  TM Distance: >3 FB Neck ROM: Full    Dental  (+) Edentulous Upper, Edentulous Lower   Pulmonary neg pulmonary ROS,  03/26/2020 SARS coronavirus NEG   breath sounds clear to auscultation       Cardiovascular hypertension, Pt. on medications (-) angina+ dysrhythmias Atrial Fibrillation + pacemaker  Rhythm:Regular Rate:Normal  03/26/2020 ECHO: EF 60-65%. LV has no regional  wall motion abnormalities,  mild left ventricular hypertrophy of the basal-septal segment. Left  ventricular diastolic parameters are consistent with Grade I diastolic dysfunction, no significant valvular abnormalities   Neuro/Psych negative neurological ROS     GI/Hepatic negative GI ROS, Neg liver ROS,   Endo/Other  Hypothyroidism Vasculitis: steroids  Renal/GU Renal InsufficiencyRenal disease     Musculoskeletal   Abdominal   Peds  Hematology negative hematology ROS (+)   Anesthesia Other Findings   Reproductive/Obstetrics                            Anesthesia Physical Anesthesia Plan  ASA: III  Anesthesia Plan: General   Post-op Pain Management:    Induction: Intravenous  PONV Risk Score and Plan: 3 and Ondansetron, Dexamethasone and Treatment may vary due to age or medical condition  Airway Management Planned: LMA  Additional Equipment: None  Intra-op Plan:   Post-operative Plan:   Informed Consent: I have reviewed the patients History and Physical, chart, labs and discussed the procedure including the risks, benefits and alternatives for the proposed anesthesia with the patient or authorized representative who  has indicated his/her understanding and acceptance.   Patient has DNR.  Discussed DNR with patient and Suspend DNR.     Plan Discussed with: CRNA and Surgeon  Anesthesia Plan Comments:       Anesthesia Quick Evaluation

## 2020-03-26 NOTE — Anesthesia Procedure Notes (Signed)
Procedure Name: LMA Insertion Date/Time: 03/26/2020 11:33 AM Performed by: Amadeo Garnet, CRNA Pre-anesthesia Checklist: Patient identified, Emergency Drugs available, Patient being monitored and Suction available Patient Re-evaluated:Patient Re-evaluated prior to induction Oxygen Delivery Method: Circle system utilized Preoxygenation: Pre-oxygenation with 100% oxygen Induction Type: IV induction LMA: LMA inserted LMA Size: 4.0 Placement Confirmation: positive ETCO2 Dental Injury: Teeth and Oropharynx as per pre-operative assessment

## 2020-03-26 NOTE — Evaluation (Signed)
Occupational Therapy Evaluation Patient Details Name: Carol Brewer MRN: [id number] DOB: 04-22-1935 Today's Date: 03/26/2020    History of Present Illness 85 y.o female presenting with pain and swelling in R index finger. Workup showing cellulitis. Pt now s/p I&D with nerve block. PMH includes paroxysmal atrial fibrillation status post pacemaker placement (2013), hypothyroidism, hyperlipidemia, hypertension   Clinical Impression   PTA pt living with husband and functioning at mod I level. Pt uses rollator vs SPC throughout home to maintain balance. At time of eval, pt able to complete bed mobility and sit <> stands with light min A and use of SPC. She mobilized into the bathroom for toilet transfer, then stood to complete x2 grooming tasks with min guard assist- min A. Pt requiring increased assist due to R index finger bulky dressing. Reviewed safe mobilization strategies for RUE digits, as well as elevating and icing at home. Pt was + for orthostatics this session, but asymptomatic. See ratings below. Per current status, recommend HHOT at d/c for continued progression of ADL mobility in home environment. Will continue to follow per POC listed below.   03/26/20 1600  Orthostatic Lying   BP- Lying 130/63  Pulse- Lying 67  Orthostatic Sitting  BP- Sitting 114/76  Pulse- Sitting 86  Orthostatic Standing at 0 minutes  BP- Standing at 0 minutes 103/66  Pulse- Standing at 0 minutes 93  Orthostatic Standing at 3 minutes  BP- Standing at 3 minutes 91/57  Pulse- Standing at 3 minutes 129      Follow Up Recommendations  Home health OT;Supervision - Intermittent    Equipment Recommendations  None recommended by OT    Recommendations for Other Services       Precautions / Restrictions Precautions Precautions: Fall Required Braces or Orthoses: Other Brace Other Brace: R index finger bulky dressing Restrictions Weight Bearing Restrictions: No Other Position/Activity Restrictions: Per  Dr. Stann Mainland op note: okay to start ROM and use of R hand as tolerated      Mobility Bed Mobility Overal bed mobility: Needs Assistance Bed Mobility: Supine to Sit     Supine to sit: Min assist     General bed mobility comments: light min A to attain upright sitting EOB    Transfers Overall transfer level: Needs assistance Equipment used: Straight cane Transfers: Sit to/from Stand Sit to Stand: Min assist         General transfer comment: light assist to rise and steady with use of SPC    Balance Overall balance assessment: Mild deficits observed, not formally tested                                         ADL either performed or assessed with clinical judgement   ADL Overall ADL's : Needs assistance/impaired Eating/Feeding: Set up;Sitting   Grooming: Minimal assistance;Standing Grooming Details (indicate cue type and reason): assist due to bulky dressing on R hand that cannot get wet Upper Body Bathing: Minimal assistance;Sitting   Lower Body Bathing: Minimal assistance;Sit to/from stand;Sitting/lateral leans   Upper Body Dressing : Minimal assistance;Sitting   Lower Body Dressing: Minimal assistance;Sit to/from stand;Sitting/lateral leans Lower Body Dressing Details (indicate cue type and reason): to pull down mesh panties prior to toileting Toilet Transfer: Minimal assistance;Ambulation;Regular Toilet;Grab bars   Toileting- Clothing Manipulation and Hygiene: Set up;Sitting/lateral lean       Functional mobility during ADLs: Minimal assistance;Cane General ADL Comments:  increased assist due to more difficult use of RUE due to bulky finger dressing     Vision Patient Visual Report: No change from baseline       Perception     Praxis      Pertinent Vitals/Pain Pain Assessment: No/denies pain     Hand Dominance Right   Extremity/Trunk Assessment Upper Extremity Assessment Upper Extremity Assessment: RUE deficits/detail RUE  Deficits / Details: R index finger in bulky dressing. Able to mobilize other digits. Noted diffuse edema RUE Coordination: decreased fine motor;decreased gross motor   Lower Extremity Assessment Lower Extremity Assessment: Defer to PT evaluation       Communication Communication Communication: No difficulties   Cognition Arousal/Alertness: Awake/alert Behavior During Therapy: WFL for tasks assessed/performed Overall Cognitive Status: Within Functional Limits for tasks assessed                                     General Comments       Exercises     Shoulder Instructions      Home Living Family/patient expects to be discharged to:: Private residence Living Arrangements: Spouse/significant other Available Help at Discharge: Family;Available 24 hours/day Type of Home: House Home Access: Level entry     Home Layout: One level     Bathroom Shower/Tub: Teacher, early years/pre: Handicapped height     Home Equipment: [place] - single point;Shower seat;Hand held shower head;Grab bars - tub/shower;Walker - 4 wheels          Prior Functioning/Environment Level of Independence: Independent with assistive device(s)        Comments: uses cane vs rollator for mobility due to dizziness and general unsteadiness; independent for ADL/IADL        OT Problem List: Decreased strength;Decreased knowledge of use of DME or AE;Decreased coordination;Decreased range of motion;Increased edema;Decreased activity tolerance;Impaired UE functional use;Impaired balance (sitting and/or standing)      OT Treatment/Interventions: Self-care/ADL training;Therapeutic exercise;Patient/family education;Balance training;Energy conservation;Therapeutic activities;DME and/or AE instruction    OT Goals(Current goals can be found in the care plan section) Acute Rehab OT Goals Patient Stated Goal: use hand as normal again OT Goal Formulation: With patient Time For Goal  Achievement: 04/09/20 Potential to Achieve Goals: Good  OT Frequency: Min 2X/week   Barriers to D/C:            Co-evaluation              AM-PAC OT "6 Clicks" Daily Activity     Outcome Measure Help from another person eating meals?: A Little Help from another person taking care of personal grooming?: A Little Help from another person toileting, which includes using toliet, bedpan, or urinal?: A Little Help from another person bathing (including washing, rinsing, drying)?: A Little Help from another person to put on and taking off regular upper body clothing?: A Little Help from another person to put on and taking off regular lower body clothing?: A Little 6 Click Score: 18   End of Session Equipment Utilized During Treatment: Gait belt Nurse Communication: Mobility status  Activity Tolerance: Patient tolerated treatment well Patient left: in chair;with call bell/phone within reach  OT Visit Diagnosis: Unsteadiness on feet (R26.81);Other abnormalities of gait and mobility (R26.89);Muscle weakness (generalized) (M62.81)                Time: 5974-1638 OT Time Calculation (min): 30 min Charges:  OT General Charges $  OT Visit: 1 Visit OT Evaluation $OT Eval Moderate Complexity: 1 Mod  .Zenovia Jarred, MSOT, OTR/L Acute Rehabilitation Services [facility] Office Number: [phone] Pager: [phone]  Zenovia Jarred 03/26/2020, 5:25 PM

## 2020-03-26 NOTE — Discharge Instructions (Signed)
Orthopedic discharge instructions:  -Elevate the right hand with your hand above your heart as able.  -You will do Dial soap soaks with warm soapy water to the right hand twice per day for 30 minutes each soak.  Do this until your wounds close.  No sutures were placed in your wound so that they can drain and heal from the inside out.  -You can use your arm and right hand as tolerated.  You should keep your incisions covered with dry dressings and tape or [place] as suited.  -You will return to see Dr. Stann Mainland in 14 days post discharge for follow-up.

## 2020-03-26 NOTE — Anesthesia Postprocedure Evaluation (Addendum)
Anesthesia Post Note  Patient: Carol Brewer  Procedure(s) Performed: IRRIGATION AND DEBRIDEMENT, INDEX FINGER (Right Finger)     Patient location during evaluation: PACU Anesthesia Type: General Level of consciousness: awake and alert, patient cooperative and oriented Pain management: pain level controlled Vital Signs Assessment: post-procedure vital signs reviewed and stable Respiratory status: spontaneous breathing, nonlabored ventilation and respiratory function stable Cardiovascular status: blood pressure returned to baseline and stable Postop Assessment: no apparent nausea or vomiting Anesthetic complications: no   No complications documented.  Last Vitals:  Vitals:   03/26/20 1240 03/26/20 1347  BP: (!) 112/56 (!) 144/76  Pulse:  68  Resp:    Temp: 36.8 C   SpO2:  90%    Last Pain:  Vitals:   03/26/20 1240  TempSrc:   PainSc: 0-No pain                 Kathryne Ramella,E. Seneca Gadbois

## 2020-03-26 NOTE — H&P (Signed)
History and Physical    Carol Brewer AVW:[id number] DOB: 07-Aug-1934 DOA: 03/25/2020  PCP: Jacqualine Code, DO  Patient coming from: Home   Chief Complaint:  Chief Complaint  Patient presents with   index finger red swollen     HPI:    85 year old female with past medical history of paroxysmal atrial fibrillation status post pacemaker placement (2013), hypothyroidism, hyperlipidemia, hypertension who presents to [facility] emergency department with swelling and pain of the right index finger.  Patient explains that approximately 5 days ago she began to develop redness and swelling of the right index finger.  Initially this was mild in intensity but over the next several days became more and more severe.  This is associated with severe pain at the finger, worse with flexion of the finger or accidentally striking it on surfaces.  Denies any associated nausea, vomiting, fever, change in appetite or generalized weakness.  As the days progressed pain swelling and redness continued to worsen.  Eventually decided to present to [facility] emergency department for evaluation.  Upon further questioning, patient also reports a 1 week history of episodic lightheadedness upon rising from a seated position.  This past Wednesday, patient also reports an episode of brief loss of consciousness.  Denies tongue biting self urination or self defecation.  Son denies any seizure activity.  Upon evaluation at [facility] emergency department, she was found to have substantial cellulitis of the index severe of the right hand.  Patient has been initiated on intravenous vancomycin.  X-ray of the right index finger reveals no evidence of osteomyelitis.  The hospitalist group has been called to assess the patient for admission to the hospital.  Review of Systems:   Review of Systems  Musculoskeletal: Positive for falls and joint pain.  Neurological: Positive for dizziness, loss of  consciousness and headaches.  All other systems reviewed and are negative.   Past Medical History:  Diagnosis Date   CHF (congestive heart failure) ([place])    Essential hypertension 03/26/2020   History of pacemaker 03/26/2020   Hypertension    Hypothyroidism 03/26/2020   Mixed hyperlipidemia 03/26/2020   Vasculitis ([place]) 03/26/2020    History reviewed. No pertinent surgical history.   reports that she has never smoked. She has never used smokeless tobacco. She reports that she does not drink alcohol. No history on file for drug use.  Allergies  Allergen Reactions   Penicillins     Family History  Problem Relation Age of Onset   Diabetes Mother    Bone cancer Father      Prior to Admission medications   Not on File    Physical Exam: Vitals:   03/26/20 0100 03/26/20 0115 03/26/20 0130 03/26/20 0145  BP: (!) 180/82 (!) 173/84 139/70 (!) 158/78  Pulse: 77 70 77 86  Resp: 16 18 15 18   Temp:      SpO2: 98% 99% 99% 100%  Weight:      Height:        Constitutional: Acute alert and oriented x3, no associated distress.   Skin: Extensive redness swelling and induration of the index finger of the right hand.  Notable area of purulence over the DIP joint somewhat poor skin turgor noted. Eyes: Small amount of bruising noted just medial to the right eye pupils are equally reactive to light.  No evidence of scleral icterus or conjunctival pallor.  ENMT: Dry mucous membranes noted.  Posterior pharynx clear of any exudate or lesions.  Neck: normal, supple, no masses, no thyromegaly.  No evidence of jugular venous distension.   Respiratory: clear to auscultation bilaterally, no wheezing, no crackles. Normal respiratory effort. No accessory muscle use.  Cardiovascular: Regular rate and rhythm, no murmurs / rubs / gallops. No extremity edema. 2+ pedal pulses. No carotid bruits.  Chest:   Nontender without crepitus or deformity.   Back:   Nontender without crepitus or  deformity. Abdomen: Abdomen is soft and nontender.  No evidence of intra-abdominal masses.  Positive bowel sounds noted in all quadrants.   Musculoskeletal: Please refer to skin examination findings for description of the index finger of the right hand.  Significant pain with passive and active range of motion of the index finger of the right hand.  Associated significant edema.  no contractures. Normal muscle tone.  Neurologic: CN 2-12 grossly intact. Sensation intact.  Patient moving all 4 extremities spontaneously.  Patient is following all commands.  Patient is responsive to verbal stimuli.   Psychiatric: Patient exhibits normal mood with appropriate affect.  Patient seems to possess insight as to their current situation.     Labs on Admission: I have personally reviewed following labs and imaging studies -   CBC: Recent Labs  Lab 03/25/20 2236  WBC 11.2*  NEUTROABS 8.0*  HGB 12.9  HCT 39.8  MCV 95.7  PLT 409   Basic Metabolic Panel: Recent Labs  Lab 03/25/20 2236  NA 135  K 4.2  CL 99  CO2 21*  GLUCOSE 104*  BUN 58*  CREATININE 2.06*  CALCIUM 11.3*   GFR: Estimated Creatinine Clearance: 21.1 mL/min (A) (by C-G formula based on SCr of 2.06 mg/dL (H)). Liver Function Tests: Recent Labs  Lab 03/25/20 2236  AST 28  ALT 19  ALKPHOS 75  BILITOT 0.7  PROT 7.1  ALBUMIN 3.4*   No results for input(s): LIPASE, AMYLASE in the last 168 hours. No results for input(s): AMMONIA in the last 168 hours. Coagulation Profile: Recent Labs  Lab 03/25/20 2236  INR 1.0   Cardiac Enzymes: No results for input(s): CKTOTAL, CKMB, CKMBINDEX, TROPONINI in the last 168 hours. BNP (last 3 results) No results for input(s): PROBNP in the last 8760 hours. HbA1C: No results for input(s): HGBA1C in the last 72 hours. CBG: No results for input(s): GLUCAP in the last 168 hours. Lipid Profile: No results for input(s): CHOL, HDL, LDLCALC, TRIG, CHOLHDL, LDLDIRECT in the last 72  hours. Thyroid Function Tests: No results for input(s): TSH, T4TOTAL, FREET4, T3FREE, THYROIDAB in the last 72 hours. Anemia Panel: No results for input(s): VITAMINB12, FOLATE, FERRITIN, TIBC, IRON, RETICCTPCT in the last 72 hours. Urine analysis:    Component Value Date/Time   COLORURINE YELLOW 03/25/2020 2224   APPEARANCEUR HAZY (A) 03/25/2020 2224   LABSPEC 1.016 03/25/2020 2224   PHURINE 6.0 03/25/2020 2224   GLUCOSEU NEGATIVE 03/25/2020 2224   HGBUR NEGATIVE 03/25/2020 2224   BILIRUBINUR NEGATIVE 03/25/2020 2224   KETONESUR NEGATIVE 03/25/2020 2224   PROTEINUR NEGATIVE 03/25/2020 2224   NITRITE NEGATIVE 03/25/2020 2224   LEUKOCYTESUR TRACE (A) 03/25/2020 2224    Radiological Exams on Admission - Personally Reviewed: DG Chest 2 View  Result Date: 03/25/2020 CLINICAL DATA:  Suspected sepsis.  Atrial fibrillation history. EXAM: CHEST - 2 VIEW COMPARISON:  None. FINDINGS: Two lead left chest wall pacemaker. The heart size and mediastinal contours are within normal limits. 4 mm right upper lobe calcified nodule likely represents of calcified granuloma. Suggestion of a retrocardiac opacity on the  frontal view not visualized on lateral view - likely due to overlying soft tissues. No focal consolidation. No pulmonary edema. No pleural effusion. No pneumothorax. No acute osseous abnormality. Multilevel degenerative changes of the spine. L1 through L3 compression fractures with kyphoplasty at the L1 level. Surgical clips in the upper abdomen. IMPRESSION: 1. No active cardiopulmonary disease. 2. L1 through L3 compression fractures -likely chronic. Electronically Signed   By: Iven Finn M.D.   On: 03/25/2020 22:57   DG Finger Index Right  Result Date: 03/26/2020 CLINICAL DATA:  Initial evaluation for acute swelling. EXAM: RIGHT INDEX FINGER 2+V COMPARISON:  None. FINDINGS: No acute fracture dislocation. Diffuse soft tissue swelling present about the right index finger. Minimal heterotopic  calcification noted adjacent to the right second middle phalanx. No radiopaque foreign body. No abnormal or dissecting soft tissue emphysema. No evidence for acute osteomyelitis. 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 IMPRESSION: 1. Diffuse soft tissue swelling about the right index finger. No evidence for acute osteomyelitis. 2. No acute fracture or dislocation. 3. 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 Electronically Signed   By: Jeannine Boga M.D.   On: 03/26/2020 01:06    Telemetry: Personally reviewed.  Rhythm is normal sinus rhythm  Assessment/Plan Principal Problem:   Cellulitis of index finger, right   Patient presenting with substantial cellulitis of the exiting of the right hand with focal area of possible purulence over the DIP joint  Emergency department provider has already initiated intravenous vancomycin therapy which I will continue at this time.  MRSA PCR testing has been ordered  Blood cultures have also been ordered  Hydrating patient with intravenous isotonic fluids  X-ray of the index area reveals no obvious evidence of osteomyelitis  Case is already been discussed with Dr. Jeannie Fend with orthopedic hand surgery who has graciously agreed to come evaluate the patient later this morning for potential surgical intervention  Active Problems:   Syncope   Patient reports 1 week history of what sounds to be orthostatic lightheadedness in addition to one episode of likely syncope this past Wednesday  Patient reports striking her head as she fell.    In this patient with known history of cardiac disease including atrial fibrillation and pacemaker placement, will perform syncope work-up while hospitalized and being treated for cellulitis of the right index finger.  Monitoring patient on telemetry  Obtaining twelve-lead EKG  Obtaining echocardiogram  Obtaining  orthostatic vital signs  Obtaining CT head due to reported head trauma.  Gently hydrating with intravenous fluids  Holding home regimen of diuretics including hydrochlorothiazide and spironolactone for now    Fall at home, initial encounter   Frequent bouts of lightheadedness at home over the past week with one episode of what sounds to be syncope last Wednesday   Obtaining CT imaging of the head due to patient reporting striking her head as she fell.   Obtaining PT evaluation   AF (paroxysmal atrial fibrillation) (HCC)   Continue home regimen of low-dose amiodarone  Patient is currently rate controlled and in normal sinus rhythm  Continue to monitor patient on telemetry    Essential hypertension   Continue home regimen of amlodipine  Temporarily holding diuretics due to reported bouts of lightheadedness and syncope  As needed intravenous hydralazine for markedly high blood pressures.    Hypothyroidism   Continue home regimen of Synthroid    Vasculitis ([place])   Patient reports longstanding history of "inflammation of  the blood vessels" but is unable to give the diagnosis  Continue home regimen of prednisone 10 mg daily  Patient recently moved here within the past year, no further records concerning this available    Mixed hyperlipidemia  Continue home regimen of gemfibrozil    Code Status:  DNR Family Communication: Care discussed with son who is at bedside and has been updated on plan of care.  Status is: Observation  The patient remains OBS appropriate and will d/c before 2 midnights.  Dispo: The patient is from: Home              Anticipated d/c is to: Home              Anticipated d/c date is: 2 days              Patient currently is not medically stable to d/c.        Vernelle Emerald MD [facility] Pager [phone]  If 7PM-7AM, please contact night-coverage [email or web address] Use universal [place] password for that web site.  If you do not have the password, please call the hospital operator.  03/26/2020, 2:51 AM

## 2020-03-26 NOTE — Op Note (Signed)
Date of Surgery: 03/26/2020  INDICATIONS: [name] is a 85 y.o.-year-old female with a right index finger felon infection.  She is right-hand dominant and presented after 5 days of increasing pain, erythema, and swelling.  She had no constitutional symptoms for systemic infection, but did had obvious signs and symptoms for finger infection.  She is indicated for urgent operative incision and treatment.;  The patient did consent to the procedure after discussion of the risks and benefits.  PREOPERATIVE DIAGNOSIS:  Right hand index finger felon  POSTOPERATIVE DIAGNOSIS: Same.  PROCEDURE:  1.  Incision and drainage of right index finger abscess 2.  Digital block of right index finger with quarter percent plain Marcaine  SURGEON: Geralynn Rile, M.D.  ASSIST: None.  ANESTHESIA:  general, digital block  IV FLUIDS AND URINE: See anesthesia.  ESTIMATED BLOOD LOSS: 2 mL.  IMPLANTS: None  DRAINS: Iodoform packing  COMPLICATIONS: None.  DESCRIPTION OF PROCEDURE: The patient was brought to the operating room and placed supine on the operating table.  The patient had been signed prior to the procedure and this was documented. The patient had the anesthesia placed by the anesthesiologist.  A time-out was performed to confirm that this was the correct patient, site, side and location. The patient did receive antibiotics prior to the incision and was re-dosed during the procedure as needed at indicated intervals.  A tourniquet of the index finger was placed utilizing a 1/4 inch Penrose drain.  This was used for 15 minutes.  The patient had the operative extremity prepped and draped in the standard surgical fashion.     A standard longitudinal incision in the mid sagittal plane of the distal phalanx was undertaken.  This was accomplished with a 15 blade.  We then bluntly dissected down to the pulp of the distal phalanx.  We encountered immediate purulence.  This was cultured and sent off to the  microbiology lab.  We then bluntly dissected proximally and distally to express any further purulence.  There was also noted to be a indurated and fluctuant area along the middle phalanx.  A counterincision was then made along the middle phalanx in a similar fashion in the mid sagittal plane.  Again we bluntly dissected down to the radial neurovascular bundle.  We then were able to use a hemostatic achieve communication between these 2 incisions.  We then copiously lavaged both of these with normal saline until the fluid ran through clear.  Next, we packed both wounds with iodoform 1/4 inch dressing.  A small tail was left available to wick the wound.  We then covered the wounds with sterile dressings.  Patient was then awakened from general anesthetic.  There were no noted complications.  All counts were correct x2.  Patient was transported to PACU in stable condition.  POSTOPERATIVE PLAN:  [name] will be returned to the hospitalist service for IV antibiotics for at least 24 more hours.  We will follow up on cultures and tailor her antibiotics as needed.  She will also have her postop dressings taken down tomorrow on the floor by the nurse.  She will then begin Dial soap soak to the right index finger twice a day for 30 minutes at a time.  She will then have standard dry dressings reapplied but will not need the incisions to be packed again.  She can begin immediate range of motion and use of the right hand.

## 2020-03-26 NOTE — Progress Notes (Signed)
  Echocardiogram 2D Echocardiogram has been performed.  Marybelle Killings 03/26/2020, 9:52 AM

## 2020-03-26 NOTE — Progress Notes (Signed)
Pharmacy Antibiotic Note  Carol Brewer is a 85 y.o. female admitted on 03/25/2020 with cellulitis.  Pharmacy has been consulted for vancomycin dosing.  Plan: Vancomycin 1000mg  x1 then 750mg  IV every 8 hours.  Goal trough 10-15 mcg/mL.  Height: 5\' 6"  (167.6 cm) Weight: 72.6 kg (160 lb) IBW/kg (Calculated) : 59.3  Temp (24hrs), Avg:97.9 F (36.6 C), Min:97.9 F (36.6 C), Max:97.9 F (36.6 C)  Recent Labs  Lab 03/25/20 2236 03/26/20 0054  WBC 11.2*  --   CREATININE 2.06*  --   LATICACIDVEN 1.6 1.3    Estimated Creatinine Clearance: 21.1 mL/min (A) (by C-G formula based on SCr of 2.06 mg/dL (H)).    Allergies  Allergen Reactions  . Penicillins      Thank you for allowing pharmacy to be a part of this patient's care.  Wynona Neat, PharmD, BCPS  03/26/2020 2:45 AM

## 2020-03-26 NOTE — Evaluation (Signed)
Physical Therapy Evaluation Patient Details Name: Carol Brewer MRN: [id number] DOB: [birth year] Today's Date: 03/26/2020   History of Present Illness  85 y.o female presenting with pain and swelling in R index finger. Workup showing cellulitis. Pt now s/p I&D with nerve block. PMH includes paroxysmal atrial fibrillation status post pacemaker placement (2013), hypothyroidism, hyperlipidemia, hypertension  Clinical Impression   Pt admitted with above diagnosis. Comes from home where she lives with her husband oin a single level home with no steps to enter; noted she has had some lightheadedness and syncopal/presyncopal episodes leading up to this admission; Presents to PT with a notable SBP drop of more than 20 mmHg between supine and standing more than 2 minutes -- asymptomatic for dizziness, but it is concerning; Will consider amb with unilateral device versus RW for bilateral support next PT session;  Pt currently with functional limitations due to the deficits listed below (see PT Problem List). Pt will benefit from skilled PT to increase their independence and safety with mobility to allow discharge to the venue listed below.       Follow Up Recommendations Home health PT;Supervision/Assistance - 24 hour    Equipment Recommendations  None recommended by PT (has cane and RW)    Recommendations for Other Services OT consult     Precautions / Restrictions Precautions Precautions: Fall Required Braces or Orthoses: Other Brace Other Brace: R index finger bulky dressing Restrictions Weight Bearing Restrictions: No Other Position/Activity Restrictions: Per Dr. Stann Mainland op note: okay to start ROM and use of R hand as tolerated      Mobility  Bed Mobility Overal bed mobility: Needs Assistance Bed Mobility: Supine to Sit     Supine to sit: Min assist     General bed mobility comments: light min A to attain upright sitting EOB    Transfers Overall transfer level: Needs  assistance Equipment used: Straight cane Transfers: Sit to/from Stand Sit to Stand: Min assist         General transfer comment: light assist to rise and steady with use of SPC  Ambulation/Gait Ambulation/Gait assistance: Min assist;Mod assist Gait Distance (Feet): 20 Feet Assistive device: Straight cane Gait Pattern/deviations: Step-through pattern;Decreased step length - right;Decreased step length - left     General Gait Details: Overall min assist with occasional mod assist for balance goin up the small uneven surface into and out of bathroom, with occasional back steps at that small incline; used cane and with notable tendency to reach out for bilateral UE support  Stairs            Wheelchair Mobility    Modified Rankin (Stroke Patients Only)       Balance Overall balance assessment: Mild deficits observed, not formally tested                                           Pertinent Vitals/Pain Pain Assessment: No/denies pain    Home Living Family/patient expects to be discharged to:: Private residence Living Arrangements: Spouse/significant other Available Help at Discharge: Family;Available 24 hours/day Type of Home: House Home Access: Level entry     Home Layout: One level Home Equipment: Cane - single point;Shower seat;Hand held shower head;Grab bars - tub/shower;Walker - 4 wheels      Prior Function Level of Independence: Independent with assistive device(s)         Comments: uses cane vs rollator  for mobility due to dizziness and general unsteadiness; independent for ADL/IADL     Hand Dominance   Dominant Hand: Right    Extremity/Trunk Assessment   Upper Extremity Assessment Upper Extremity Assessment: Defer to OT evaluation RUE Deficits / Details: R index finger in bulky dressing. Able to mobilize other digits. Noted diffuse edema RUE Coordination: decreased fine motor;decreased gross motor    Lower Extremity  Assessment Lower Extremity Assessment: Generalized weakness       Communication   Communication: No difficulties  Cognition Arousal/Alertness: Awake/alert Behavior During Therapy: WFL for tasks assessed/performed Overall Cognitive Status: Within Functional Limits for tasks assessed                                        General Comments General comments (skin integrity, edema, etc.):   03/26/20 1600  Orthostatic Lying   BP- Lying 130/63  Pulse- Lying 67  Orthostatic Sitting  BP- Sitting 114/76  Pulse- Sitting 86  Orthostatic Standing at 0 minutes  BP- Standing at 0 minutes 103/66  Pulse- Standing at 0 minutes 93  Orthostatic Standing at 3 minutes  BP- Standing at 3 minutes 91/57  Pulse- Standing at 3 minutes 129      Exercises     Assessment/Plan    PT Assessment Patient needs continued PT services  PT Problem List Decreased strength;Decreased activity tolerance;Decreased balance;Decreased coordination;Decreased knowledge of use of DME;Decreased knowledge of precautions       PT Treatment Interventions DME instruction;Gait training;Stair training;Functional mobility training;Therapeutic activities;Therapeutic exercise;Balance training;Patient/family education    PT Goals (Current goals can be found in the Care Plan section)  Acute Rehab PT Goals Patient Stated Goal: use hand as normal again PT Goal Formulation: With patient Time For Goal Achievement: 04/09/20 Potential to Achieve Goals: Good    Frequency Min 3X/week   Barriers to discharge        Co-evaluation               AM-PAC PT "6 Clicks" Mobility  Outcome Measure Help needed turning from your back to your side while in a flat bed without using bedrails?: None Help needed moving from lying on your back to sitting on the side of a flat bed without using bedrails?: A Little Help needed moving to and from a bed to a chair (including a wheelchair)?: A Little Help needed standing up  from a chair using your arms (e.g., wheelchair or bedside chair)?: A Little Help needed to walk in hospital room?: A Little Help needed climbing 3-5 steps with a railing? : A Lot 6 Click Score: 18    End of Session   Activity Tolerance: Patient tolerated treatment well Patient left: in chair;with call bell/phone within reach;with chair alarm set Nurse Communication: Mobility status PT Visit Diagnosis: Unsteadiness on feet (R26.81);Other abnormalities of gait and mobility (R26.89)    Time: 3570-1779 PT Time Calculation (min) (ACUTE ONLY): 36 min   Charges:   PT Evaluation $PT Eval Low Complexity: [place], PT  Acute Rehabilitation Services Pager [phone] Office [phone]   Colletta [place] 03/26/2020, 5:54 PM

## 2020-03-26 NOTE — Plan of Care (Signed)
Patient is alert and oriented x4 pre and post operatively. Patient post op pain is 0/10 verbally. Patient ambulating to the bathroom with walker and assistance. Patient tolerating oral fluids and food. Dressing to rt hand remain clean dry and intact.  Patient encouraged to use her lt hand. Patient updated on plan of care and in agreement with plan. Son is POA and updated on plan of care.  Problem: Clinical Measurements: Goal: Ability to avoid or minimize complications of infection will improve Outcome: Progressing   Problem: Skin Integrity: Goal: Skin integrity will improve Outcome: Progressing   Problem: Clinical Measurements: Goal: Postoperative complications will be avoided or minimized Outcome: Progressing   Problem: Skin Integrity: Goal: Demonstration of wound healing without infection will improve Outcome: Progressing   Problem: Education: Goal: Knowledge of General Education information will improve Description: Including pain rating scale, medication(s)/side effects and non-pharmacologic comfort measures Outcome: Progressing   Problem: Health Behavior/Discharge Planning: Goal: Ability to manage health-related needs will improve Outcome: Progressing   Problem: Clinical Measurements: Goal: Ability to maintain clinical measurements within normal limits will improve Outcome: Progressing Goal: Will remain free from infection Outcome: Progressing Goal: Diagnostic test results will improve Outcome: Progressing Goal: Respiratory complications will improve Outcome: Progressing Goal: Cardiovascular complication will be avoided Outcome: Progressing   Problem: Activity: Goal: Risk for activity intolerance will decrease Outcome: Progressing   Problem: Nutrition: Goal: Adequate nutrition will be maintained Outcome: Progressing   Problem: Coping: Goal: Level of anxiety will decrease Outcome: Progressing   Problem: Elimination: Goal: Will not experience complications  related to bowel motility Outcome: Progressing Goal: Will not experience complications related to urinary retention Outcome: Progressing   Problem: Pain Managment: Goal: General experience of comfort will improve Outcome: Progressing   Problem: Safety: Goal: Ability to remain free from injury will improve Outcome: Progressing   Problem: Skin Integrity: Goal: Risk for impaired skin integrity will decrease Outcome: Progressing

## 2020-03-26 NOTE — Transfer of Care (Signed)
Immediate Anesthesia Transfer of Care Note  Patient: Carol Brewer  Procedure(s) Performed: IRRIGATION AND DEBRIDEMENT, INDEX FINGER (Right Finger)  Patient Location: PACU  Anesthesia Type:General  Level of Consciousness: awake, alert  and oriented  Airway & Oxygen Therapy: Patient Spontanous Breathing  Post-op Assessment: Report given to RN, Post -op Vital signs reviewed and stable and Patient moving all extremities  Post vital signs: Reviewed and stable  Last Vitals:  Vitals Value Taken Time  BP 143/67 03/26/20 1211  Temp 36.8 C 03/26/20 1210  Pulse 68 03/26/20 1218  Resp 18 03/26/20 1218  SpO2 96 % 03/26/20 1218  Vitals shown include unvalidated device data.  Last Pain:  Vitals:   03/26/20 1210  TempSrc:   PainSc: 0-No pain      Patients Stated Pain Goal: 2 (50/56/97 9480)  Complications: No complications documented.

## 2020-03-27 ENCOUNTER — Encounter (HOSPITAL_COMMUNITY): Payer: Self-pay | Admitting: Orthopedic Surgery

## 2020-03-27 DIAGNOSIS — Z9181 History of falling: Secondary | ICD-10-CM

## 2020-03-27 DIAGNOSIS — L03011 Cellulitis of right finger: Secondary | ICD-10-CM | POA: Diagnosis not present

## 2020-03-27 DIAGNOSIS — R42 Dizziness and giddiness: Secondary | ICD-10-CM

## 2020-03-27 DIAGNOSIS — L02511 Cutaneous abscess of right hand: Secondary | ICD-10-CM

## 2020-03-27 DIAGNOSIS — R5383 Other fatigue: Secondary | ICD-10-CM

## 2020-03-27 MED ORDER — ADULT MULTIVITAMIN W/MINERALS CH
1.0000 | ORAL_TABLET | Freq: Every day | ORAL | Status: DC
  Administered 2020-03-27 – 2020-03-28 (×2): 1 via ORAL
  Filled 2020-03-27 (×2): qty 1

## 2020-03-27 MED ORDER — SODIUM CHLORIDE 0.9 % IV SOLN
2.0000 g | INTRAVENOUS | Status: DC
  Administered 2020-03-27 – 2020-03-28 (×2): 2 g via INTRAVENOUS
  Filled 2020-03-27 (×2): qty 2

## 2020-03-27 NOTE — Progress Notes (Signed)
Subjective: Patient is a 85 year old female 1 day s/p I&D of the right index finger on 03/26/20. Her preliminary cultures showed gram negative rods. Pharmacy has adjusted her antibiotics.    PREOPERATIVE DIAGNOSIS:  Right hand index finger felon   POSTOPERATIVE DIAGNOSIS: Same.   PROCEDURE:  1.  Incision and drainage of right index finger abscess 2.  Digital block of right index finger with quarter percent plain Marcaine   SURGEON: Geralynn Rile, M.D.     Patient reports pain as mild.  She denies numbness or tingling. She reports sensitivity to the right index finger and swelling of her digits in general.   Objective:   VITALS:   Vitals:   03/26/20 1347 03/26/20 1750 03/26/20 2052 03/27/20 0530  BP: (!) 144/76 132/68 (!) 118/57 136/74  Pulse: 68 65 68 66  Resp:  18 20 18   Temp:  98.4 F (36.9 C) 99.2 F (37.3 C) 97.6 F (36.4 C)  TempSrc:  Oral Oral Oral  SpO2: 90% 95% 94% 98%  Weight:      Height:        Constitutional: General Appearance: healthy-appearing, well-nourished, and well-developed. Level of Distress: no acute distress. Eyes: Lens (normal) clear: both eyes. Head: Head: normocephalic and atraumatic. Lungs: Respiratory effort: no dyspnea. Skin: Inspection and palpation: no rash, lesions Neurologic: Cranial Nerves: grossly intact. Sensation: grossly intact. Psychiatric: Insight: good judgement and insight. Mental Status: normal mood and affect and active and alert. Orientation: to time, place, and person.  Right Hand:   Appearance/Alignment: Mild edema, Normal alignment w/o rotational deformities. Skin: Healing index finger felon s/p right index finger I&D with two radial incisions. Mild erythema, no spreading erythema. No significant drainage with removal of packing. Mild swelling of the 2-5th digits. Neurovascular: SILT. Brisk capillary refill. 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        Lab Results  Component Value Date   WBC 10.4 03/26/2020   HGB 12.1 03/26/2020   HCT 37.0 03/26/2020   MCV 95.6 03/26/2020   PLT 261 03/26/2020   BMET    Component Value Date/Time   NA 135 03/26/2020 0534   K 3.8 03/26/2020 0534   CL 101 03/26/2020 0534   CO2 21 (L) 03/26/2020 0534   GLUCOSE 86 03/26/2020 0534   BUN 53 (H) 03/26/2020 0534   CREATININE 1.89 (H) 03/26/2020 0534   CALCIUM 10.4 (H) 03/26/2020 0534   GFRNONAA 26 (L) 03/26/2020 0534     Assessment/Plan: 1 Day Post-Op   Principal Problem:   Cellulitis of index finger, right Active Problems:   AF (paroxysmal atrial fibrillation) ([place])   Essential hypertension   Hypothyroidism   Mixed hyperlipidemia   Vasculitis ([place])   Syncope   Fall at home, initial encounter   Cellulitis and abscess of hand   Advance diet Continue IV antibiotic  Therapy per pharmacy and primary Begin Dial (antibacterial) soap soaks BID for 30 minutes,Dry  digits then replace dressing with non adherent pad over the wounds and dry dressing (wrap or gauze pad) secured with coban. following soaks. Discussed with nurse.   Discharge - per primary  Weightbearing Status: WBAT , ROM as tolerated. Advised patient to work on gentle ROM.   Patient will follow up in 2 weeks with Dr. Stann Mainland or Jonelle Sidle at [facility].    Faythe Casa 03/27/2020, 10:06 AM  Jonelle Sidle PA-C  Physician Assistant with Dr. Lillia Abed [facility]

## 2020-03-27 NOTE — Progress Notes (Signed)
PROGRESS NOTE  Carol Brewer MVH:[id number] DOB: 1935/01/02 DOA: 03/25/2020 PCP: Jacqualine Code, DO  Brief History   85 year old female with past medical history of paroxysmal atrial fibrillation status post pacemaker placement (2013), hypothyroidism, hyperlipidemia, hypertension who presents to [facility] emergency department with swelling and pain of the right index finger.  Patient explains that approximately 5 days ago she began to develop redness and swelling of the right index finger.  Initially this was mild in intensity but over the next several days became more and more severe.  This is associated with severe pain at the finger, worse with flexion of the finger or accidentally striking it on surfaces.  Denies any associated nausea, vomiting, fever, change in appetite or generalized weakness.  As the days progressed pain swelling and redness continued to worsen.  Eventually decided to present to [facility] emergency department for evaluation.  Upon further questioning, patient also reports a 1 week history of episodic lightheadedness upon rising from a seated position.  This past Wednesday, patient also reports an episode of brief loss of consciousness.  Denies tongue biting self urination or self defecation.  Son denies any seizure activity.  Upon evaluation at [facility] emergency department, she was found to have substantial cellulitis of the index severe of the right hand.  Patient has been initiated on intravenous vancomycin.  X-ray of the right index finger reveals no evidence of osteomyelitis.  The hospitalist group has been called to assess the patient for admission to the hospital.  Dr. Stann Mainland was consulted for hand surgery. The patient went to the OR for open debridement and irrigation of the index finger of the right hand. They have recommended continued IV antibiotics and follow up with them in 2 weeks at [facility] clinic.  Infectious  disease has been consulted to assist with duration of IV therapy, antibiotic choice and oral options.  Consultants   Orthopedic surgery  Procedures   Open debridement and irrigation of the right index finger.  Antibiotics   Anti-infectives (From admission, onward)   Start     Dose/Rate Route Frequency Ordered Stop   03/27/20 1000  ceFEPIme (MAXIPIME) 2 g in sodium chloride 0.9 % 100 mL IVPB        2 g 200 mL/hr over 30 Minutes Intravenous Every 24 hours 03/27/20 0908     03/27/20 0000  vancomycin (VANCOREADY) IVPB 750 mg/150 mL  Status:  Discontinued        750 mg 150 mL/hr over 60 Minutes Intravenous Every 24 hours 03/26/20 0248 03/27/20 0914   03/26/20 1119  ceFAZolin (ANCEF) 2-4 GM/100ML-% IVPB       Note to Pharmacy: Bobbie Stack   : cabinet override      03/26/20 1119 03/26/20 2329   03/26/20 0045  vancomycin (VANCOCIN) IVPB 1000 mg/200 mL premix        1,000 mg 200 mL/hr over 60 Minutes Intravenous  Once 03/26/20 0035 03/26/20 0250      Subjective  The patient is sitting up at bedside. No new complaints.  Objective   Vitals:  Vitals:   03/27/20 0530 03/27/20 1333  BP: 136/74 113/65  Pulse: 66 71  Resp: 18 19  Temp: 97.6 F (36.4 C) 97.9 F (36.6 C)  SpO2: 98% 99%   Exam:  Constitutional:   The patient is awake, alert, and oriented x 3. No acute distress. Respiratory:   No increased work of breathing.  No wheezes, rales, or rhonchi  No  tactile fremitus Cardiovascular:   Regular rate and rhythm  No murmurs, ectopy, or gallups.  No lateral PMI. No thrills. Abdomen:   Abdomen is soft, non-tender, non-distended  No hernias, masses, or organomegaly  Normoactive bowel sounds.  Musculoskeletal:   No cyanosis, clubbing, or edema  Right index finger is spinted and bandaged. Skin:   No rashes, lesions, ulcers  palpation of skin: no induration or nodules Neurologic:   CN 2-12 intact  Sensation all 4 extremities intact Psychiatric:    Mental status o Mood, affect appropriate o Orientation to person, place, time   judgment and insight appear intact  I have personally reviewed the following:   Today's Data   Vitals, CMP, CBC  Micro Data   Blood cultures x 2: No growht  Wound Culture: Pending  Imaging   X-ray of the right hand.  Scheduled Meds:  amiodarone  50 mg Oral Daily   amLODipine  5 mg Oral Daily   docusate sodium  100 mg Oral BID   feeding supplement  237 mL Oral BID BM   gemfibrozil  600 mg Oral Daily   levothyroxine  112 mcg Oral Q0600   multivitamin with minerals  1 tablet Oral Daily   predniSONE  10 mg Oral Q breakfast   Continuous Infusions:  ceFEPime (MAXIPIME) IV 2 g (03/27/20 1041)    Principal Problem:   Cellulitis of index finger, right Active Problems:   AF (paroxysmal atrial fibrillation) ([facility])   Essential hypertension   Hypothyroidism   Mixed hyperlipidemia   Vasculitis ([place])   Syncope   Fall at home, initial encounter   Cellulitis and abscess of hand   LOS: 1 day   A & P  Cellulitis of index finger, right: Pt is s/p open debridement and irrigation of the right index finger by orthopedic surgery. She is receiving IV Vancomycin. Will dc vanc and replace with ancef. Blood cultures have had no growth. No osteomyelitis on x-ray. Dr. Stann Mainland was consulted for hand surgery. The patient went to the OR for open debridement and irrigation of the index finger of the right hand. They have recommended continued IV antibiotics and follow up with them in 2 weeks at [facility] clinic. Infectious disease has been consulted to assist with duration of IV therapy, antibiotic choice and oral options.  Syncope: Patient reports 1 week history of what sounds to be orthostatic lightheadedness in addition to one episode of likely syncope this past Wednesday.Patient reports striking her head as she fell.  CT head negative. Pt will be monitored on telemetry. EKG demonstrates  age-indeterminate septal infarct. Troponin is elevated. Likely due to elevated creatinine. Echocardiogram is within normal limits. HCTZ and spironolactone are held. Will also check orthostatics.   Fall at home, initial encounter: Frequent bouts of lightheadedness at home over the past week with one episode of what sounds to be syncope last Wednesday. Obtaining CT imaging of the head due to patient reporting striking her head as she fell. Obtaining PT evaluation.  AF (paroxysmal atrial fibrillation) ([place]): Continue home regimen of low-dose amiodarone. Patient is currently rate controlled and in normal sinus rhythm. Continue to monitor patient on telemetry.  Essential hypertension: Continue home regimen of amlodipine. Temporarily holding diuretics due to reported bouts of lightheadedness and syncope. As needed intravenous hydralazine for markedly high blood pressures.  Hypothyroidism: Continue home regimen of Synthroid  Vasculitis ([place]): Patient reports longstanding history of "inflammation of the blood vessels" but is unable to give the diagnosis. Continue home regimen  of prednisone 10 mg daily. Patient recently moved here within the past year, no further records concerning this available.  Mixed hyperlipidemia: Continue home regimen of gemfibrozil  I have seen and examined this patient myself. I have spent 36 minutes in her evaluation and care.  Code Status:  DNR Family Communication: None at bedside. Status is: Observation The patient remains OBS appropriate and will d/c before 2 midnights. Dispo: The patient is from: Home  Anticipated d/c is to: Home  Anticipated d/c date is: 2 days  Patient currently is not medically stable to d/c.  Maxwell Martorano, DO [facility] Direct contact: see [email or web address]  7PM-7AM contact night coverage as above 03/27/2020, 3:11 PM  LOS: 0 days

## 2020-03-27 NOTE — Progress Notes (Addendum)
Pharmacy Antibiotic Note  Carol Brewer is a 85 y.o. female admitted on 03/25/2020 with red and swollen right index finger.  Underwent I&D on 03/26/20 and OR culture is showing GNR on the Gram stain.  Pharmacy has been consulted to switch antibiotic to cefepime for cellulitis/abscess.  SCr down 1.89, CrCL 23 ml/min,a febrile, WBC WNL, LA 1.3.  Plan: Cefepime 2gm IV Q24H Monitor renal fxn, micro data to de-escalate  Height: 5\' 6"  (167.6 cm) Weight: 72.6 kg (160 lb) IBW/kg (Calculated) : 59.3  Temp (24hrs), Avg:98.4 F (36.9 C), Min:97.6 F (36.4 C), Max:99.2 F (37.3 C)  Recent Labs  Lab 03/25/20 2236 03/26/20 0054 03/26/20 0534  WBC 11.2*  --  10.4  CREATININE 2.06*  --  1.89*  LATICACIDVEN 1.6 1.3  --     Estimated Creatinine Clearance: 23 mL/min (A) (by C-G formula based on SCr of 1.89 mg/dL (H)).    Allergies  Allergen Reactions  . Lisinopril Swelling  . Sulfa Antibiotics Hives   Vanc 11/28 >> 11/29 Cefepime 11/29 >>  11/27 BCx - NGTD 11/28 MRSA PCR - negative 11/28 R index finger abscess, OR cx - GNR on Gram stain  Lashone Stauber D. Mina Marble, PharmD, BCPS, [place] 03/27/2020, 9:15 AM

## 2020-03-27 NOTE — Progress Notes (Signed)
Occupational Therapy Treatment Patient Details Name: Carol Brewer MRN: [id number] DOB: Jan 16, 1935 Today's Date: 03/27/2020    History of present illness 85 y.o female presenting with pain and swelling in R index finger. Workup showing cellulitis. Pt now s/p I&D with nerve block. PMH includes paroxysmal atrial fibrillation status post pacemaker placement (2013), hypothyroidism, hyperlipidemia, hypertension   OT comments  Pt progressing well. Pt using RUE without difficulty and aware of bulky dressing and limitations. Pt education for movement of RUE with HEP; edema control placing arm at level of heart and ice if needed.  Diffuse edema noted in RUE.VSS on RA. Pt ambulatory in room and hallway a community distance with O2 <90% and no pain reported; supervisionA with SPC. Pt would benefit from continued OT skilled services. OT following acutely.     Follow Up Recommendations  Home health OT    Equipment Recommendations  None recommended by OT    Recommendations for Other Services      Precautions / Restrictions Precautions Precautions: Fall Required Braces or Orthoses: Other Brace Other Brace: R index finger bulky dressing Restrictions Weight Bearing Restrictions: No Other Position/Activity Restrictions: Per Dr. Stann Mainland op note: okay to start ROM and use of R hand as tolerated       Mobility Bed Mobility               General bed mobility comments: in recliner pre and post session  Transfers Overall transfer level: Needs assistance Equipment used: Straight cane Transfers: Sit to/from Stand Sit to Stand: Supervision         General transfer comment: no physical assist from recliner to stand; from [facility] to sink    Balance Overall balance assessment: Mild deficits observed, not formally tested                                         ADL either performed or assessed with clinical judgement   ADL Overall ADL's : Needs  assistance/impaired Eating/Feeding: Set up;Sitting   Grooming: Set up;Standing               Lower Body Dressing: Minimal assistance;Sitting/lateral leans;Sit to/from stand Lower Body Dressing Details (indicate cue type and reason): education for figure 4 technique Toilet Transfer: Supervision/safety;Ambulation   Toileting- Clothing Manipulation and Hygiene: Supervision/safety;Sitting/lateral lean;Sit to/from stand       Functional mobility during ADLs: Supervision/safety;Cane General ADL Comments: Pt using RUE without difficulty and aware of bulky dressing and limitations. Pt education for movement of RUE with HEP; edema control placing arm at level of heart and ice if needed.      Vision   Vision Assessment?: No apparent visual deficits   Perception     Praxis      Cognition Arousal/Alertness: Awake/alert Behavior During Therapy: WFL for tasks assessed/performed Overall Cognitive Status: Within Functional Limits for tasks assessed                                          Exercises Exercises: Other exercises Other Exercises Other Exercises: edema control of RUE with elevation Other Exercises: HEP for RUE shoulder through non immobilized digits.   Shoulder Instructions       General Comments VSS on RA. Pt ambulatory inroom and hallway a community distance with O2 <90% and no pain reported;  supervisionA with SPC.    Pertinent Vitals/ Pain       Pain Assessment: No/denies pain  Home Living                                          Prior Functioning/Environment              Frequency  Min 2X/week        Progress Toward Goals  OT Goals(current goals can now be found in the care plan section)  Progress towards OT goals: Progressing toward goals  Acute Rehab OT Goals Patient Stated Goal: use hand as normal again OT Goal Formulation: With patient Time For Goal Achievement: 04/09/20 Potential to Achieve Goals:  Good ADL Goals Pt Will Perform Upper Body Dressing: with modified independence;sitting Pt Will Perform Lower Body Dressing: with modified independence;sit to/from stand Pt Will Transfer to Toilet: with modified independence;ambulating;regular height toilet Pt/caregiver will Perform Home Exercise Program: Increased ROM;Right Upper extremity;Independently  Plan Discharge plan remains appropriate    Co-evaluation                 AM-PAC OT "6 Clicks" Daily Activity     Outcome Measure   Help from another person eating meals?: None Help from another person taking care of personal grooming?: A Little Help from another person toileting, which includes using toliet, bedpan, or urinal?: A Little Help from another person bathing (including washing, rinsing, drying)?: A Little Help from another person to put on and taking off regular upper body clothing?: None Help from another person to put on and taking off regular lower body clothing?: A Little 6 Click Score: 20    End of Session Equipment Utilized During Treatment: Gait belt  OT Visit Diagnosis: Unsteadiness on feet (R26.81);Muscle weakness (generalized) (M62.81)   Activity Tolerance Patient tolerated treatment well   Patient Left in chair;with call bell/phone within reach   Nurse Communication Mobility status        Time: 2500-3704 OT Time Calculation (min): 23 min  Charges: OT General Charges $OT Visit: 1 Visit OT Treatments $Self Care/Home Management : 8-22 mins $Therapeutic Activity: 8-22 mins  Jefferey Pica, OTR/L Acute Rehabilitation Services Pager: [phone] Office: [phone]    Maxi Carreras C 03/27/2020, 5:13 PM

## 2020-03-27 NOTE — Plan of Care (Signed)
Patient up to chair and ambulating to bathroom with 1 assist and cane.  Orthopedic PA came to see the patient and removed dressing and recommended daily soaking of rt hand in soapy warm water and dressed with non-adherent pad, gauze sponge and pressure wrapped per PA verbal orders to Probation officer. Patient tolerating pain this shift.   Problem: Clinical Measurements: Goal: Ability to avoid or minimize complications of infection will improve Outcome: Progressing   Problem: Skin Integrity: Goal: Skin integrity will improve Outcome: Progressing   Problem: Clinical Measurements: Goal: Postoperative complications will be avoided or minimized Outcome: Progressing   Problem: Skin Integrity: Goal: Demonstration of wound healing without infection will improve Outcome: Progressing   Problem: Education: Goal: Knowledge of General Education information will improve Description: Including pain rating scale, medication(s)/side effects and non-pharmacologic comfort measures Outcome: Progressing   Problem: Health Behavior/Discharge Planning: Goal: Ability to manage health-related needs will improve Outcome: Progressing   Problem: Clinical Measurements: Goal: Ability to maintain clinical measurements within normal limits will improve Outcome: Progressing Goal: Will remain free from infection Outcome: Progressing Goal: Diagnostic test results will improve Outcome: Progressing Goal: Respiratory complications will improve Outcome: Progressing Goal: Cardiovascular complication will be avoided Outcome: Progressing   Problem: Activity: Goal: Risk for activity intolerance will decrease Outcome: Progressing   Problem: Nutrition: Goal: Adequate nutrition will be maintained Outcome: Progressing   Problem: Coping: Goal: Level of anxiety will decrease Outcome: Progressing   Problem: Elimination: Goal: Will not experience complications related to bowel motility Outcome: Progressing Goal: Will not  experience complications related to urinary retention Outcome: Progressing   Problem: Pain Managment: Goal: General experience of comfort will improve Outcome: Progressing   Problem: Safety: Goal: Ability to remain free from injury will improve Outcome: Progressing   Problem: Skin Integrity: Goal: Risk for impaired skin integrity will decrease Outcome: Progressing

## 2020-03-27 NOTE — Consult Note (Signed)
[facility] for Infectious Disease    Date of Admission:  03/25/2020      Total days of antibiotics 2  Cefepime   (Previously Vancomycin)          Reason for Consult: Finger abscess     Referring Provider: Swayze  Primary Care Provider: Jacqualine Code, DO   Assessment: Carol Brewer is a 85 y.o. female with acute abscess development involving the right index finger now POD1 following debridement. Operative note removed and does not seem to have involved bone or intricate structures of proximal hand/fingers. She claims no injury to the site that she can recall. Initial gram stain with gram negative rods - continues on cefepime alone for now. Hopefully we will have more information regarding growth and ID tomorrow. Would be helpful to get a pathogen before considering sending her home. Would continue cefepime alone for now and follow.     Plan: 1. Continue cefepime  2. Follow micro information for final recommendations.     Principal Problem:   Cellulitis of index finger, right Active Problems:   AF (paroxysmal atrial fibrillation) ([place])   Essential hypertension   Hypothyroidism   Mixed hyperlipidemia   Vasculitis ([place])   Syncope   Fall at home, initial encounter   Cellulitis and abscess of hand   . amiodarone  50 mg Oral Daily  . amLODipine  5 mg Oral Daily  . docusate sodium  100 mg Oral BID  . feeding supplement  237 mL Oral BID BM  . gemfibrozil  600 mg Oral Daily  . levothyroxine  112 mcg Oral Q0600  . multivitamin with minerals  1 tablet Oral Daily  . predniSONE  10 mg Oral Q breakfast    HPI: Carol Brewer is a 85 y.o. female admitted from home for pain and swelling of the right index finger. This initially started as some mild pain and swelling about 5 days prior to ER visit that progressed to severe throbbing pain and swelling. No known injuries that she can recall - denies any animal or human bites, no trauma. She did fall at home and  injure herself more than she expected regarding her head/eye and knees, but this was after her finger started hurting. She does acknowledge she "was not feeling well" the whole week prior to hospital care.   Dr. Stann Mainland from orthopedics was consulted for debridement and took her to surgery on 03/26/2020 to drain right index finger abscess. She had one are of purulence near the distal phalanx with further indurated fluctuant tissue along the middle phalanx. No mentioning operative nurse that bone was involved.   She has done fine on current antibiotics and has no complaints today aside from when she may go home.  No fevers or chills. Finger pain is much better since drainage.    Review of Systems: Review of Systems  Constitutional: Positive for malaise/fatigue. Negative for chills and fever.  HENT: Negative for tinnitus.   Eyes: Negative for blurred vision and photophobia.  Respiratory: Negative for cough and sputum production.   Cardiovascular: Negative for chest pain.  Gastrointestinal: Negative for diarrhea, nausea and vomiting.  Genitourinary: Negative for dysuria.  Musculoskeletal: Positive for joint pain.  Skin: Negative for rash.  Neurological: Positive for dizziness. Negative for headaches.    Past Medical History:  Diagnosis Date  . CHF (congestive heart failure) ([place])   . Essential hypertension 03/26/2020  . History of pacemaker 03/26/2020  . Hypertension   .  Hypothyroidism 03/26/2020  . Mixed hyperlipidemia 03/26/2020  . Vasculitis ([place]) 03/26/2020    Social History   Tobacco Use  . Smoking status: Never Smoker  . Smokeless tobacco: Never Used  Vaping Use  . Vaping Use: Never used  Substance Use Topics  . Alcohol use: Never  . Drug use: Not on file    Family History  Problem Relation Age of Onset  . Diabetes Mother   . Bone cancer Father    Allergies  Allergen Reactions  . Lisinopril Swelling  . Sulfa Antibiotics Hives    OBJECTIVE: Blood pressure  113/65, pulse 71, temperature 97.9 F (36.6 C), temperature source Oral, resp. rate 19, height 5\' 6"  (1.676 m), weight 72.6 kg, SpO2 99 %.  Physical Exam Vitals reviewed.  Constitutional:      Comments: Seated comfortably in recliner eating lunch. Pleasant. No distress.   HENT:     Mouth/Throat:     Mouth: No oral lesions.     Dentition: No dental abscesses.  Eyes:     Comments: Ecchymosis that appears to be healing R inner canthus.   Cardiovascular:     Rate and Rhythm: Normal rate and regular rhythm.     Heart sounds: Normal heart sounds.  Pulmonary:     Effort: Pulmonary effort is normal.     Breath sounds: Normal breath sounds.  Abdominal:     General: There is no distension.     Palpations: Abdomen is soft.     Tenderness: There is no abdominal tenderness.  Musculoskeletal:        General: No tenderness. Normal range of motion.     Comments: R finger is wrapped in gauze and loose ACE wrap that is coming off - I removed and re-applied for her. Some bleeding noted under the bandage.   Lymphadenopathy:     Cervical: No cervical adenopathy.  Skin:    General: Skin is warm and dry.     Capillary Refill: Capillary refill takes less than 2 seconds.     Findings: No rash.  Neurological:     Mental Status: She is alert and oriented to person, place, and time.  Psychiatric:        Judgment: Judgment normal.     Lab Results Lab Results  Component Value Date   WBC 10.4 03/26/2020   HGB 12.1 03/26/2020   HCT 37.0 03/26/2020   MCV 95.6 03/26/2020   PLT 261 03/26/2020    Lab Results  Component Value Date   CREATININE 1.89 (H) 03/26/2020   BUN 53 (H) 03/26/2020   NA 135 03/26/2020   K 3.8 03/26/2020   CL 101 03/26/2020   CO2 21 (L) 03/26/2020    Lab Results  Component Value Date   ALT 17 03/26/2020   AST 23 03/26/2020   ALKPHOS 73 03/26/2020   BILITOT 0.9 03/26/2020     Microbiology: Recent Results (from the past 240 hour(s))  Culture, blood (Routine x 2)      Status: None (Preliminary result)   Collection Time: 03/25/20 10:15 PM   Specimen: BLOOD LEFT ARM  Result Value Ref Range Status   Specimen Description BLOOD LEFT ARM  Final   Special Requests   Final    BOTTLES DRAWN AEROBIC AND ANAEROBIC Blood Culture results may not be optimal due to an excessive volume of blood received in culture bottles   Culture   Final    NO GROWTH 2 DAYS Performed at [facility], [place]  [address] [place], [place] [address]    Report Status PENDING  Incomplete  Culture, blood (Routine x 2)     Status: None (Preliminary result)   Collection Time: 03/25/20 10:33 PM   Specimen: BLOOD RIGHT ARM  Result Value Ref Range Status   Specimen Description BLOOD RIGHT ARM  Final   Special Requests   Final    BOTTLES DRAWN AEROBIC ONLY Blood Culture adequate volume   Culture   Final    NO GROWTH 2 DAYS Performed at [facility], [place] [address], [place], [place] [address]    Report Status PENDING  Incomplete  Resp Panel by RT-PCR (Flu A&B, Covid) Nasopharyngeal Swab     Status: None   Collection Time: 03/26/20 12:54 AM   Specimen: Nasopharyngeal Swab; Nasopharyngeal(NP) swabs in vial transport medium  Result Value Ref Range Status   SARS Coronavirus 2 by RT PCR NEGATIVE NEGATIVE Final    Comment: (NOTE) SARS-CoV-2 target nucleic acids are NOT DETECTED.  The SARS-CoV-2 RNA is generally detectable in upper respiratory specimens during the acute phase of infection. The lowest concentration of SARS-CoV-2 viral copies this assay can detect is 138 copies/mL. A negative result does not preclude SARS-Cov-2 infection and should not be used as the sole basis for treatment or other patient management decisions. A negative result may occur with  improper specimen collection/handling, submission of specimen other than nasopharyngeal swab, presence of viral mutation(s) within the areas targeted by this assay, and inadequate number of viral copies(<138 copies/mL). A  negative result must be combined with clinical observations, patient history, and epidemiological information. The expected result is Negative.  Fact Sheet for Patients:  EntrepreneurPulse.com.au  Fact Sheet for Healthcare Providers:  IncredibleEmployment.be  This test is no t yet approved or cleared by the [place] FDA and  has been authorized for detection and/or diagnosis of SARS-CoV-2 by FDA under an Emergency Use Authorization (EUA). This EUA will remain  in effect (meaning this test can be used) for the duration of the COVID-19 declaration under Section 564(b)(1) of the Act, 21 U.S.C.section 360bbb-3(b)(1), unless the authorization is terminated  or revoked sooner.       Influenza A by PCR NEGATIVE NEGATIVE Final   Influenza B by PCR NEGATIVE NEGATIVE Final    Comment: (NOTE) The Xpert Xpress SARS-CoV-2/FLU/RSV plus assay is intended as an aid in the diagnosis of influenza from Nasopharyngeal swab specimens and should not be used as a sole basis for treatment. Nasal washings and aspirates are unacceptable for Xpert Xpress SARS-CoV-2/FLU/RSV testing.  Fact Sheet for Patients: EntrepreneurPulse.com.au  Fact Sheet for Healthcare Providers: IncredibleEmployment.be  This test is not yet approved or cleared by the [place] FDA and has been authorized for detection and/or diagnosis of SARS-CoV-2 by FDA under an Emergency Use Authorization (EUA). This EUA will remain in effect (meaning this test can be used) for the duration of the COVID-19 declaration under Section 564(b)(1) of the Act, 21 U.S.C. section 360bbb-3(b)(1), unless the authorization is terminated or revoked.  Performed at [facility], [place] [address] [place],  [address]   MRSA PCR Screening     Status: None   Collection Time: 03/26/20  6:11 AM   Specimen: Nasal Mucosa; Nasopharyngeal  Result Value Ref Range Status     MRSA by PCR NEGATIVE NEGATIVE Final    Comment:        The GeneXpert MRSA Assay (FDA approved for NASAL specimens only), is one component of a comprehensive MRSA colonization  surveillance program. It is not intended to diagnose MRSA infection nor to guide or monitor treatment for MRSA infections. Performed at [facility], [place] [address] [place], [place] [address]   Aerobic/Anaerobic Culture (surgical/deep wound)     Status: None (Preliminary result)   Collection Time: 03/26/20 11:45 AM   Specimen: PATH Other; Body Fluid  Result Value Ref Range Status   Specimen Description ABSCESS  Final   Special Requests RIGHT INDEX FINGER ABSCESS SPEC A  Final   Gram Stain   Final    MODERATE WBC PRESENT, PREDOMINANTLY PMN FEW GRAM NEGATIVE RODS    Culture   Final    NO GROWTH < 24 HOURS Performed at [facility], [place] [address], [place], [place] [address]    Report Status PENDING  Incomplete     Janene Madeira, MSN, NP-C [facility] Infectious Disease [email or web address] Pager: [phone] Office: [place]: [phone]  03/27/2020 3:26 PM

## 2020-03-27 NOTE — Progress Notes (Signed)
Initial Nutrition Assessment  DOCUMENTATION CODES:   Not applicable  INTERVENTION:   -Ensure Enlive po BID, each supplement provides 350 kcal and 20 grams of protein -MVI with minerals daily  NUTRITION DIAGNOSIS:   Increased nutrient needs related to post-op healing as evidenced by estimated needs.  GOAL:   Patient will meet greater than or equal to 90% of their needs  MONITOR:   PO intake, Supplement acceptance, Labs, Weight trends, Skin, I & O's  REASON FOR ASSESSMENT:   Malnutrition Screening Tool    ASSESSMENT:   85 year old female with past medical history of paroxysmal atrial fibrillation status post pacemaker placement (2013), hypothyroidism, hyperlipidemia, hypertension who presents to [facility] emergency department with swelling and pain of the right index finger.  Pt admitted with rt index finger cellulitis.   11/28- s/p PROCEDURE:  1.  Incision and drainage of right index finger abscess 2.  Digital block of right index finger with quarter percent plain Marcaine  Pt unavailable at time of visit.   No meal completion data available at time of visit. No wt hx available.   Pt with increased nutritional needs for post-op healing and would benefit from addition of oral nutrition supplements.   Medications reviewed and include prednisone.   Labs reviewed.   Diet Order:   Diet Order            Diet regular Room service appropriate? Yes; Fluid consistency: Thin  Diet effective now                 EDUCATION NEEDS:   No education needs have been identified at this time  Skin:  Skin Assessment: Skin Integrity Issues: Skin Integrity Issues:: Incisions Incisions: closed rt index finger  Last BM:  03/25/20  Height:   Ht Readings from Last 1 Encounters:  03/25/20 5\' 6"  (1.676 m)    Weight:   Wt Readings from Last 1 Encounters:  03/25/20 72.6 kg    Ideal Body Weight:  59.1 kg  BMI:  Body mass index is 25.82 kg/m.  Estimated  Nutritional Needs:   Kcal:  1800-2000  Protein:  90-105 grams  Fluid:  > 1.8 L    Loistine Chance, RD, LDN, [place] Registered Dietitian II Certified Diabetes Care and Education Specialist Please refer to [facility] for RD and/or RD on-call/weekend/after hours pager

## 2020-03-28 ENCOUNTER — Other Ambulatory Visit: Payer: Self-pay

## 2020-03-28 ENCOUNTER — Encounter (HOSPITAL_COMMUNITY): Payer: Self-pay | Admitting: Internal Medicine

## 2020-03-28 DIAGNOSIS — L03011 Cellulitis of right finger: Secondary | ICD-10-CM | POA: Diagnosis not present

## 2020-03-28 LAB — CBC WITH DIFFERENTIAL/PLATELET
Abs Immature Granulocytes: 0.12 10*3/uL — ABNORMAL HIGH (ref 0.00–0.07)
Basophils Absolute: 0 10*3/uL (ref 0.0–0.1)
Basophils Relative: 0 %
Eosinophils Absolute: 0 10*3/uL (ref 0.0–0.5)
Eosinophils Relative: 0 %
HCT: 31.2 % — ABNORMAL LOW (ref 36.0–46.0)
Hemoglobin: 10.3 g/dL — ABNORMAL LOW (ref 12.0–15.0)
Immature Granulocytes: 1 %
Lymphocytes Relative: 15 %
Lymphs Abs: 1.5 10*3/uL (ref 0.7–4.0)
MCH: 31.1 pg (ref 26.0–34.0)
MCHC: 33 g/dL (ref 30.0–36.0)
MCV: 94.3 fL (ref 80.0–100.0)
Monocytes Absolute: 0.7 10*3/uL (ref 0.1–1.0)
Monocytes Relative: 7 %
Neutro Abs: 7.8 10*3/uL — ABNORMAL HIGH (ref 1.7–7.7)
Neutrophils Relative %: 77 %
Platelets: 256 10*3/uL (ref 150–400)
RBC: 3.31 MIL/uL — ABNORMAL LOW (ref 3.87–5.11)
RDW: 13.3 % (ref 11.5–15.5)
WBC: 10.2 10*3/uL (ref 4.0–10.5)
nRBC: 0 % (ref 0.0–0.2)

## 2020-03-28 LAB — COMPREHENSIVE METABOLIC PANEL
ALT: 8 U/L (ref 0–44)
AST: 18 U/L (ref 15–41)
Albumin: 2.5 g/dL — ABNORMAL LOW (ref 3.5–5.0)
Alkaline Phosphatase: 55 U/L (ref 38–126)
Anion gap: 10 (ref 5–15)
BUN: 60 mg/dL — ABNORMAL HIGH (ref 8–23)
CO2: 20 mmol/L — ABNORMAL LOW (ref 22–32)
Calcium: 8.8 mg/dL — ABNORMAL LOW (ref 8.9–10.3)
Chloride: 100 mmol/L (ref 98–111)
Creatinine, Ser: 1.69 mg/dL — ABNORMAL HIGH (ref 0.44–1.00)
GFR, Estimated: 30 mL/min — ABNORMAL LOW (ref >60)
Glucose, Bld: 122 mg/dL — ABNORMAL HIGH (ref 70–99)
Potassium: 3.8 mmol/L (ref 3.5–5.1)
Sodium: 130 mmol/L — ABNORMAL LOW (ref 135–145)
Total Bilirubin: 0.5 mg/dL (ref 0.3–1.2)
Total Protein: 5.5 g/dL — ABNORMAL LOW (ref 6.5–8.1)

## 2020-03-28 MED ORDER — AMOXICILLIN-POT CLAVULANATE 500-125 MG PO TABS: 1.0000 | ORAL_TABLET | Freq: Two times a day (BID) | ORAL | 0 refills | Status: AC

## 2020-03-28 MED ORDER — AMIODARONE HCL 100 MG PO TABS: 50.0000 mg | ORAL_TABLET | Freq: Every day | ORAL | 0 refills | Status: AC

## 2020-03-28 MED ORDER — ENSURE ENLIVE PO LIQD: 237.0000 mL | Freq: Two times a day (BID) | ORAL | 12 refills | Status: AC

## 2020-03-28 MED ORDER — AMOXICILLIN-POT CLAVULANATE 875-125 MG PO TABS: 1.0000 | ORAL_TABLET | Freq: Two times a day (BID) | ORAL | 0 refills | Status: DC

## 2020-03-28 MED ORDER — DOCUSATE SODIUM 100 MG PO CAPS: 100.0000 mg | ORAL_CAPSULE | Freq: Two times a day (BID) | ORAL | 0 refills | Status: AC

## 2020-03-28 NOTE — Progress Notes (Signed)
Physical Therapy Treatment Patient Details Name: Carol Brewer MRN: [id number] DOB: 1935-01-22 Today's Date: 03/28/2020    History of Present Illness 85 y.o female presenting with pain and swelling in R index finger. Workup showing cellulitis. Pt now s/p I&D with nerve block. PMH includes paroxysmal atrial fibrillation status post pacemaker placement (2013), hypothyroidism, hyperlipidemia, hypertension    PT Comments    Continuing work on functional mobility and activity tolerance;  Session focused on obtaining Orthostatic BPs (see other note of this date), and working on safe progression of amb distance; very nice progress towards goals; Rec full time use of rollator RW initially upon dc, pt voiced understanding   Follow Up Recommendations  Home health PT;Supervision/Assistance - 24 hour     Equipment Recommendations  None recommended by PT (Rec initial full time use of rollator)    Recommendations for Other Services       Precautions / Restrictions Precautions Precautions: Fall Required Braces or Orthoses: Other Brace Other Brace: R index finger bulky dressing Restrictions Other Position/Activity Restrictions: Per Dr. Stann Mainland op note: okay to start ROM and use of R hand as tolerated    Mobility  Bed Mobility Overal bed mobility: Needs Assistance Bed Mobility: Supine to Sit     Supine to sit: Supervision     General bed mobility comments: No physical assist needed  Transfers Overall transfer level: Needs assistance Equipment used: Straight cane Transfers: Sit to/from Stand Sit to Stand: Supervision         General transfer comment: Noted slight post lean, bracing backs of LEs against bad for stability  Ambulation/Gait Ambulation/Gait assistance: Min guard Gait Distance (Feet): 550 Feet Assistive device: Straight cane Gait Pattern/deviations: Step-through pattern;Decreased step length - right;Decreased step length - left Gait velocity: slowed   General Gait  Details: physical contact for close guard with use of cane instead of bil UE support; still, no overt loss of balance noted   Stairs             Wheelchair Mobility    Modified Rankin (Stroke Patients Only)       Balance Overall balance assessment: Mild deficits observed, not formally tested                                          Cognition Arousal/Alertness: Awake/alert Behavior During Therapy: WFL for tasks assessed/performed Overall Cognitive Status: Within Functional Limits for tasks assessed                                        Exercises      General Comments General comments (skin integrity, edema, etc.): see other note of this date for Orthostatic BPs      Pertinent Vitals/Pain Pain Assessment: No/denies pain    Home Living                      Prior Function            PT Goals (current goals can now be found in the care plan section) Acute Rehab PT Goals Patient Stated Goal: use hand as normal again PT Goal Formulation: With patient Time For Goal Achievement: 04/09/20 Potential to Achieve Goals: Good Progress towards PT goals: Progressing toward goals    Frequency    Min 3X/week  PT Plan Current plan remains appropriate    Co-evaluation              AM-PAC PT "6 Clicks" Mobility   Outcome Measure  Help needed turning from your back to your side while in a flat bed without using bedrails?: None Help needed moving from lying on your back to sitting on the side of a flat bed without using bedrails?: A Little Help needed moving to and from a bed to a chair (including a wheelchair)?: A Little Help needed standing up from a chair using your arms (e.g., wheelchair or bedside chair)?: A Little Help needed to walk in hospital room?: A Little Help needed climbing 3-5 steps with a railing? : A Little 6 Click Score: 19    End of Session Equipment Utilized During Treatment: Gait  belt Activity Tolerance: Patient tolerated treatment well Patient left: in bed;with call bell/phone within reach Nurse Communication: Mobility status PT Visit Diagnosis: Unsteadiness on feet (R26.81);Other abnormalities of gait and mobility (R26.89)     Time: 1100-1138 PT Time Calculation (min) (ACUTE ONLY): 38 min  Charges:  $Gait Training: 23-37 mins $Therapeutic Activity: 8-22 mins                     Roney Marion, [place] Pager [phone] Office [place] 03/28/2020, 3:52 PM

## 2020-03-28 NOTE — Progress Notes (Signed)
[facility]  Date of Admission:  03/25/2020      Total days of antibiotics 3  Cefepime           ASSESSMENT: Carol Brewer is a 85 y.o. female with abscess involving the right index finger POD2 debridement. Bone not affected nor any deep complex structures. Gram stain with GNRs but culture only reveals normal skin flora without anything predominating. No pseudomonas. Planning on transition to PO Augmentin, renally dosed at discharge to complete 2 weeks from surgery. Will arrange a follow up in ID clinic at that time to ensure adequate treatment, especially given her chronic prednisone use.     PLAN: 1. Plan to discharge with Augmentin to complete 14 day course antibiotics from surgery 2. FU final culture report (pending as of 11/30) 3. FU in ID clinic 12/15 @ 3:00 pm with Janene Madeira, NP     Principal Problem:   Cellulitis of index finger, right Active Problems:   AF (paroxysmal atrial fibrillation) ([place])   Essential hypertension   Hypothyroidism   Mixed hyperlipidemia   Vasculitis ([place])   Syncope   Fall at home, initial encounter   Cellulitis and abscess of hand   . amiodarone  50 mg Oral Daily  . amLODipine  5 mg Oral Daily  . docusate sodium  100 mg Oral BID  . feeding supplement  237 mL Oral BID BM  . gemfibrozil  600 mg Oral Daily  . levothyroxine  112 mcg Oral Q0600  . multivitamin with minerals  1 tablet Oral Daily  . predniSONE  10 mg Oral Q breakfast    SUBJECTIVE: Doing well today. Enjoyed her walk around the unit with PT.  Finger pain improving. Had dressing changed today and resulted in some bleeding. She says it was just two small clean slits. Swelling and pain better.  No fevers or chills. Doing well on antibiotics without any side effects.    Review of Systems: Review of Systems  Constitutional: Negative for chills and fever.  HENT: Negative for tinnitus.   Eyes: Negative for blurred vision and  photophobia.  Respiratory: Negative for cough and sputum production.   Cardiovascular: Negative for chest pain.  Gastrointestinal: Negative for diarrhea, nausea and vomiting.  Genitourinary: Negative for dysuria.  Musculoskeletal: Positive for joint pain (finger pain ).  Skin: Negative for rash.  Neurological: Negative for headaches.    Allergies  Allergen Reactions  . Lisinopril Swelling  . Sulfa Antibiotics Hives    OBJECTIVE: Vitals:   03/27/20 1333 03/27/20 2142 03/28/20 0509 03/28/20 1022  BP: 113/65 128/63 121/79 126/75  Pulse: 71 63 67 80  Resp: 19 15 17    Temp: 97.9 F (36.6 C) 97.6 F (36.4 C) 97.9 F (36.6 C)   TempSrc: Oral Oral Oral   SpO2: 99% 99% 99% 99%  Weight:      Height:       Body mass index is 25.82 kg/m.  Physical Exam Vitals and nursing note reviewed.  Constitutional:      Appearance: She is well-developed.     Comments: Seated comfortably in chair.   HENT:     Mouth/Throat:     Mouth: No oral lesions.     Dentition: Normal dentition. No dental abscesses.     Pharynx: No oropharyngeal exudate.  Cardiovascular:     Rate and Rhythm: Normal rate.     Comments: + orthostatics reviewed with PT.  Pulmonary:  Effort: Pulmonary effort is normal.  Lymphadenopathy:     Cervical: No cervical adenopathy.  Skin:    General: Skin is warm and dry.     Findings: No rash.  Neurological:     Mental Status: She is alert and oriented to person, place, and time.  Psychiatric:        Judgment: Judgment normal.     Comments: In good spirits today and engaged in care discussion     Lab Results Lab Results  Component Value Date   WBC 10.2 03/28/2020   HGB 10.3 (L) 03/28/2020   HCT 31.2 (L) 03/28/2020   MCV 94.3 03/28/2020   PLT 256 03/28/2020    Lab Results  Component Value Date   CREATININE 1.69 (H) 03/28/2020   BUN 60 (H) 03/28/2020   NA 130 (L) 03/28/2020   K 3.8 03/28/2020   CL 100 03/28/2020   CO2 20 (L) 03/28/2020    Lab Results    Component Value Date   ALT 8 03/28/2020   AST 18 03/28/2020   ALKPHOS 55 03/28/2020   BILITOT 0.5 03/28/2020     Microbiology: Recent Results (from the past 240 hour(s))  Culture, blood (Routine x 2)     Status: None (Preliminary result)   Collection Time: 03/25/20 10:15 PM   Specimen: BLOOD LEFT ARM  Result Value Ref Range Status   Specimen Description BLOOD LEFT ARM  Final   Special Requests   Final    BOTTLES DRAWN AEROBIC AND ANAEROBIC Blood Culture results may not be optimal due to an excessive volume of blood received in culture bottles   Culture   Final    NO GROWTH 3 DAYS Performed at [facility], [place] [address] [place], [place] [address]    Report Status PENDING  Incomplete  Culture, blood (Routine x 2)     Status: None (Preliminary result)   Collection Time: 03/25/20 10:33 PM   Specimen: BLOOD RIGHT ARM  Result Value Ref Range Status   Specimen Description BLOOD RIGHT ARM  Final   Special Requests   Final    BOTTLES DRAWN AEROBIC ONLY Blood Culture adequate volume   Culture   Final    NO GROWTH 3 DAYS Performed at [facility], [place] [address], [place], [place] [address]    Report Status PENDING  Incomplete  Resp Panel by RT-PCR (Flu A&B, Covid) Nasopharyngeal Swab     Status: None   Collection Time: 03/26/20 12:54 AM   Specimen: Nasopharyngeal Swab; Nasopharyngeal(NP) swabs in vial transport medium  Result Value Ref Range Status   SARS Coronavirus 2 by RT PCR NEGATIVE NEGATIVE Final    Comment: (NOTE) SARS-CoV-2 target nucleic acids are NOT DETECTED.  The SARS-CoV-2 RNA is generally detectable in upper respiratory specimens during the acute phase of infection. The lowest concentration of SARS-CoV-2 viral copies this assay can detect is 138 copies/mL. A negative result does not preclude SARS-Cov-2 infection and should not be used as the sole basis for treatment or other patient management decisions. A negative result may occur with  improper  specimen collection/handling, submission of specimen other than nasopharyngeal swab, presence of viral mutation(s) within the areas targeted by this assay, and inadequate number of viral copies(<138 copies/mL). A negative result must be combined with clinical observations, patient history, and epidemiological information. The expected result is Negative.  Fact Sheet for Patients:  EntrepreneurPulse.com.au  Fact Sheet for Healthcare Providers:  IncredibleEmployment.be  This test is no t yet  approved or cleared by the [place] and  has been authorized for detection and/or diagnosis of SARS-CoV-2 by FDA under an Emergency Use Authorization (EUA). This EUA will remain  in effect (meaning this test can be used) for the duration of the COVID-19 declaration under Section 564(b)(1) of the Act, 21 U.S.C.section 360bbb-3(b)(1), unless the authorization is terminated  or revoked sooner.       Influenza A by PCR NEGATIVE NEGATIVE Final   Influenza B by PCR NEGATIVE NEGATIVE Final    Comment: (NOTE) The Xpert Xpress SARS-CoV-2/FLU/RSV plus assay is intended as an aid in the diagnosis of influenza from Nasopharyngeal swab specimens and should not be used as a sole basis for treatment. Nasal washings and aspirates are unacceptable for Xpert Xpress SARS-CoV-2/FLU/RSV testing.  Fact Sheet for Patients: EntrepreneurPulse.com.au  Fact Sheet for Healthcare Providers: IncredibleEmployment.be  This test is not yet approved or cleared by the [place] FDA and has been authorized for detection and/or diagnosis of SARS-CoV-2 by FDA under an Emergency Use Authorization (EUA). This EUA will remain in effect (meaning this test can be used) for the duration of the COVID-19 declaration under Section 564(b)(1) of the Act, 21 U.S.C. section 360bbb-3(b)(1), unless the authorization is terminated or revoked.  Performed at  [facility], [place] [address] [place], [place] [address]   MRSA PCR Screening     Status: None   Collection Time: 03/26/20  6:11 AM   Specimen: Nasal Mucosa; Nasopharyngeal  Result Value Ref Range Status   MRSA by PCR NEGATIVE NEGATIVE Final    Comment:        The GeneXpert MRSA Assay (FDA approved for NASAL specimens only), is one component of a comprehensive MRSA colonization surveillance program. It is not intended to diagnose MRSA infection nor to guide or monitor treatment for MRSA infections. Performed at [facility], [place] [address], [place], [place] [address]   Fungus Culture With Stain     Status: None (Preliminary result)   Collection Time: 03/26/20 11:45 AM   Specimen: PATH Other; Body Fluid  Result Value Ref Range Status   Fungus Stain Final report  Final    Comment: (NOTE) Performed At: [facility] [place], [place] [id number] Rush Farmer MD [id number]    Fungus (Mycology) Culture PENDING  Incomplete   Fungal Source ABSCESS  Final    Comment: RIGHT INDEX FINGER Performed at [facility], [place] [address], [place], [place] [address]   Aerobic/Anaerobic Culture (surgical/deep wound)     Status: None (Preliminary result)   Collection Time: 03/26/20 11:45 AM   Specimen: PATH Other; Body Fluid  Result Value Ref Range Status   Specimen Description ABSCESS  Final   Special Requests RIGHT INDEX FINGER ABSCESS SPEC A  Final   Gram Stain   Final    MODERATE WBC PRESENT, PREDOMINANTLY PMN FEW GRAM NEGATIVE RODS    Culture   Final    NORMAL SKIN FLORA Performed at [facility], [place] [address], [place], [place] [address]    Report Status PENDING  Incomplete  Fungus Culture Result     Status: None   Collection Time: 03/26/20 11:45 AM  Result Value Ref Range Status   Result 1 Comment  Final    Comment: (NOTE) KOH/Calcofluor preparation:  no fungus observed. Performed At: [facility] [place], [place] [id number] Rush Farmer MD [id number]      Janene Madeira, MSN, NP-C  [facility] [email or web address] Pager: [phone] Office: [phone] [place]: [phone]

## 2020-03-28 NOTE — Progress Notes (Signed)
Physical Therapy Treatment Note  (Full Note to follow)  Seen pt for PT with focus on activity tolerance, obtaining serial BPs;  Notable for large SBP drop between supine and standing, but with modest rebound at 3 minutes standing, and back to near supine values standing after walking the unit; Advised pt to take everything slowly and self-monitor for activity tolerance.    03/28/20 1107 03/28/20 1139  Orthostatic Lying   BP- Lying 156/88 (Map 108)  --   Pulse- Lying 74  --   Orthostatic Sitting  BP- Sitting 113/87 (Map 96)  --   Pulse- Sitting 85  --   Orthostatic Standing at 0 minutes  BP- Standing at 0 minutes 98/69 (map 79)  --   Pulse- Standing at 0 minutes 92  --   Orthostatic Standing at 3 minutes  BP- Standing at 3 minutes 121/81 (map 94) 150/88 (Map 106; after hallway ambulation)  Pulse- Standing at 3 minutes [place], [place] Pager [phone] Office [phone]

## 2020-03-28 NOTE — Progress Notes (Signed)
Subjective: Patient is a 85 year old female 2 day s/p I&D of the right index finger on 03/26/20. Her preliminary cultures showed gram negative rods. Pharmacy has adjusted her antibiotics. Infectious disease has been consulted and evaluated the patient.    PREOPERATIVE DIAGNOSIS:  Right hand index finger felon   POSTOPERATIVE DIAGNOSIS: Same.   PROCEDURE:  1.  Incision and drainage of right index finger abscess 2.  Digital block of right index finger with quarter percent plain [place]   SURGEON: Geralynn Rile, M.D.     Patient reports mild pain. She reports she would like her bandages on her back and need changed. She denies numbness or tingling. She denies fever or chills.  Objective:   VITALS:   Vitals:   03/27/20 1333 03/27/20 2142 03/28/20 0509 03/28/20 1022  BP: 113/65 128/63 121/79 126/75  Pulse: 71 63 67 80  Resp: 19 15 17    Temp: 97.9 F (36.6 C) 97.6 F (36.4 C) 97.9 F (36.6 C)   TempSrc: Oral Oral Oral   SpO2: 99% 99% 99% 99%  Weight:      Height:        Constitutional: General Appearance: healthy-appearing, well-nourished, and well-developed. Level of Distress: no acute distress. Eyes: Lens (normal) clear: both eyes. Head: Head: normocephalic and atraumatic. Lungs: Respiratory effort: no dyspnea. Skin: Inspection and palpation: no rash, lesions Neurologic: Cranial Nerves: grossly intact. Sensation: grossly intact. Psychiatric: Insight: good judgement and insight. Mental Status: normal mood and affect and active and alert. Orientation: to time, place, and person.  Right upper back skin tear  4x6cm and ecchymosis, serous drainage no signs of infection.  Right knee abrasion 3x3cm healing granulation tissue no signs of infection.    Right Hand:   Appearance/Alignment: Improving edema, Normal alignment w/o rotational deformities. Skin: Healing index finger felon s/p right index finger I&D with two radial incisions. Mild erythema, no spreading erythema.  No significant drainage with removal of packing, mild bloody drainage.. Mild swelling of the 2-5th digits. Small pustules at the proximal area of the wound. Lightening of the skin around the wounds.  Neurovascular: SILT. Brisk capillary refill. 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            Lab Results  Component Value Date   WBC 10.2 03/28/2020   HGB 10.3 (L) 03/28/2020   HCT 31.2 (L) 03/28/2020   MCV 94.3 03/28/2020   PLT 256 03/28/2020   BMET    Component Value Date/Time   NA 130 (L) 03/28/2020 0344   K 3.8 03/28/2020 0344   CL 100 03/28/2020 0344   CO2 20 (L) 03/28/2020 0344   GLUCOSE 122 (H) 03/28/2020 0344   BUN 60 (H) 03/28/2020 0344   CREATININE 1.69 (H) 03/28/2020 0344   CALCIUM 8.8 (L) 03/28/2020 0344   GFRNONAA 30 (L) 03/28/2020 0344     Assessment/Plan: 2 Days Post-Op   Principal Problem:   Cellulitis of index  finger, right Active Problems:   AF (paroxysmal atrial fibrillation) ([place])   Essential hypertension   Hypothyroidism   Mixed hyperlipidemia   Vasculitis ([place])   Syncope   Fall at home, initial encounter   Cellulitis and abscess of hand   Advance diet  Continue OT/PT Continue IV antibiotic  Therapy while in house, D/C on Augmentin per infectious disease - per their note   Plan to discharge with Augmentin to complete 14 day course antibiotics from surgery FU  final culture report (pending as of 11/30) FU in ID clinic 12/15 @ 3:00 pm with Janene Madeira, NP   New mepilex placed on the right upper back with nonadherent pad , new bandage place on right anterior knee. Simple bandaid placed on left hand healing wound.   Continue Dial (antibacterial) soap soaks BID for 30 minutes,Dry digits then replace dressing with non adherent pad over the wounds and dry dressing (wrap or gauze pad) secured with coban. following soaks,  Discharge - per primary  Weightbearing Status: WBAT , ROM as tolerated. Advised patient to work on gentle ROM.   Patient will follow up in 2 weeks with Dr. Stann Mainland or Jonelle Sidle at [facility].    Faythe Casa 03/28/2020, 12:55 PM  Jonelle Sidle PA-C  Physician Assistant with Dr. Lillia Abed [facility]

## 2020-03-28 NOTE — Plan of Care (Signed)
  Problem: Clinical Measurements: Goal: Ability to avoid or minimize complications of infection will improve Outcome: Progressing   Problem: Skin Integrity: Goal: Skin integrity will improve Outcome: Progressing   Problem: Clinical Measurements: Goal: Postoperative complications will be avoided or minimized Outcome: Progressing   Problem: Skin Integrity: Goal: Demonstration of wound healing without infection will improve Outcome: Progressing   Problem: Education: Goal: Knowledge of General Education information will improve Description: Including pain rating scale, medication(s)/side effects and non-pharmacologic comfort measures Outcome: Progressing

## 2020-03-28 NOTE — Progress Notes (Signed)
Thea Silversmith to be D/C'd  per MD order. Discussed with the patient and husband and all questions fully answered.  VSS, Skin clean, dry and intact without evidence of skin break down, no evidence of skin tears noted.  IV catheter discontinued intact. Site without signs and symptoms of complications. Dressing and pressure applied.  An After Visit Summary was printed and given to the patient.  D/c education completed with patient and husband including follow up instructions, medication list, d/c activities limitations if indicated, with other d/c instructions as indicated by MD - patient able to verbalize understanding, all questions fully answered.   Patient instructed to return to ED, call 911, or call MD for any changes in condition.   Patient to be escorted via [place], and D/C home via private auto.

## 2020-03-28 NOTE — Discharge Summary (Addendum)
Physician Discharge Summary  Carol Brewer FVC:[id number] DOB: 1934-07-24 DOA: 03/25/2020  PCP: Jacqualine Code, DO  Admit date: 03/25/2020 Discharge date: 03/28/2020  Recommendations for Outpatient Follow-up:  Discharge to home with home health PT/OT/RN Follow up with PCP in 7-10 days. Complete oral antibiotics Follow up with Dr. Stann Mainland or Thereasa Solo at [facility] in 2 weeks. Follow up with infectious disease in their clinic on 04/12/2020.   Follow-up Information     Nicholes Stairs, MD In 2 weeks.   Specialty: Orthopedic Surgery Why: For wound re-check Contact information: [address] [place] [address] [phone]         [facility], [place] Follow up.   Why: [facility] information: [place] [address] [phone]                  Discharge Diagnoses: Principal diagnosis is #1 Cellulitis and abscess/tenosynovitis right index finger Syncope Atrial fibrillation Fall Hypertension Hypothyroidism Vasculitis Hyperlipidemia CKD IV Chronic diastolic CHF  Discharge Condition: Fair  Disposition: Home with home health  Diet recommendation: Heart healthy  Filed Weights   03/25/20 2217  Weight: 72.6 kg    History of present illness: 85 year old female with past medical history of paroxysmal atrial fibrillation status post pacemaker placement (2013), hypothyroidism, hyperlipidemia, hypertension who presents to [facility] emergency department with swelling and pain of the right index finger.   Patient explains that approximately 5 days ago she began to develop redness and swelling of the right index finger.  Initially this was mild in intensity but over the next several days became more and more severe.  This is associated with severe pain at the finger, worse with flexion of the finger or accidentally striking it on surfaces.  Denies any associated nausea, vomiting,  fever, change in appetite or generalized weakness.   As the days progressed pain swelling and redness continued to worsen.  Eventually decided to present to [facility] emergency department for evaluation.   Upon further questioning, patient also reports a 1 week history of episodic lightheadedness upon rising from a seated position.  This past Wednesday, patient also reports an episode of brief loss of consciousness.  Denies tongue biting self urination or self defecation.  Son denies any seizure activity.   Upon evaluation at [facility] emergency department, she was found to have substantial cellulitis of the index severe of the right hand.  Patient has been initiated on intravenous vancomycin.  X-ray of the right index finger reveals no evidence of osteomyelitis.  The hospitalist group has been called to assess the patient for admission to the hospital.  Hospital Course:  85 year old female with past medical history of paroxysmal atrial fibrillation status post pacemaker placement (2013), hypothyroidism, hyperlipidemia, hypertension who presents to [facility] emergency department with swelling and pain of the right index finger.   Patient explains that approximately 5 days ago she began to develop redness and swelling of the right index finger.  Initially this was mild in intensity but over the next several days became more and more severe.  This is associated with severe pain at the finger, worse with flexion of the finger or accidentally striking it on surfaces.  Denies any associated nausea, vomiting, fever, change in appetite or generalized weakness.   As the days progressed pain swelling and redness continued to worsen.  Eventually decided to present to [facility] emergency department for evaluation.   Upon  further questioning, patient also reports a 1 week history of episodic lightheadedness upon rising from a seated position.  This past Wednesday, patient  also reports an episode of brief loss of consciousness.  Denies tongue biting self urination or self defecation.  Son denies any seizure activity.   Upon evaluation at [facility] emergency department, she was found to have substantial cellulitis of the index severe of the right hand.  Patient has been initiated on intravenous vancomycin.  X-ray of the right index finger reveals no evidence of osteomyelitis.  The hospitalist group has been called to assess the patient for admission to the hospital.   Dr. Stann Mainland was consulted for hand surgery. The patient went to the OR for open debridement and irrigation of the index finger of the right hand. They have recommended continued IV antibiotics and follow up with them in 2 weeks at [facility] clinic.   Infectious disease has been consulted to assist with duration of IV therapy, antibiotic choice and oral options. They have recommended transitioning the patient to oral Augmentin for a total of 14 days of therapy following surgery. They will follow cultures and see the patient tin clinic on 04/12/2020. The patient has been cleared for discharge by orthopedic surgery. They will also see the patient in follow up in 2 weeks.  Today's assessment: S: The patient is getting ready to go home. No new complaints. O: Vitals:  Vitals:   03/28/20 1022 03/28/20 1507  BP: 126/75 128/83  Pulse: 80 70  Resp:  16  Temp:  98.6 F (37 C)  SpO2: 99% 98%   Exam:   Constitutional:  The patient is awake, alert, and oriented x 3. No acute distress. Respiratory:  No increased work of breathing. No wheezes, rales, or rhonchi No tactile fremitus Cardiovascular:  Regular rate and rhythm No murmurs, ectopy, or gallups. No lateral PMI. No thrills. Abdomen:  Abdomen is soft, non-tender, non-distended No hernias, masses, or organomegaly Normoactive bowel sounds.  Musculoskeletal:  No cyanosis, clubbing, or edema Right index finger is spinted and  bandaged. Skin:  No rashes, lesions, ulcers palpation of skin: no induration or nodules Neurologic:  CN 2-12 intact Sensation all 4 extremities intact Psychiatric:  Mental status Mood, affect appropriate Orientation to person, place, time  judgment and insight appear intact   Discharge Instructions  Discharge Instructions     Activity as tolerated - No restrictions   Complete by: As directed    Call MD for:  redness, tenderness, or signs of infection (pain, swelling, redness, odor or green/yellow discharge around incision site)   Complete by: As directed    Call MD for:  severe uncontrolled pain   Complete by: As directed    Call MD for:  temperature >100.4   Complete by: As directed    Diet - low sodium heart healthy   Complete by: As directed    Discharge instructions   Complete by: As directed    Discharge to home with home health PT/OT/RN Follow up with PCP in 7-10 days. Complete oral antibiotics Follow up with Dr. Stann Mainland or Thereasa Solo at [facility] in 2 weeks.   Discharge wound care:   Complete by: As directed    Begin Dial (antibacterial) soap soaks BID for 30 minutes,Dry digits then replace dressing with non adherent pad over the wounds and dry dressing (wrap or gauze pad) secured with coban. following soaks.   Increase activity slowly   Complete by: As directed  Allergies as of 03/28/2020       Reactions   Lisinopril Swelling   Sulfa Antibiotics Hives        Medication List     TAKE these medications    amiodarone 100 MG tablet Commonly known as: PACERONE Take 0.5 tablets (50 mg total) by mouth daily. Start taking on: March 29, 2020 What changed:  medication strength how much to take   amLODipine 5 MG tablet Commonly known as: NORVASC Take 5 mg by mouth daily.   amoxicillin-clavulanate 500-125 MG tablet Commonly known as: Augmentin Take 1 tablet (500 mg total) by mouth 2 (two) times daily for 13 days.   aspirin 325 MG tablet Take  162.5 mg by mouth daily.   cetirizine 10 MG tablet Commonly known as: ZYRTEC Take 10 mg by mouth daily as needed for allergies.   docusate sodium 100 MG capsule Commonly known as: COLACE Take 1 capsule (100 mg total) by mouth 2 (two) times daily.   feeding supplement Liqd Take 237 mLs by mouth 2 (two) times daily between meals.   FISH OIL PO Take 1 Dose by mouth daily.   gemfibrozil 600 MG tablet Commonly known as: LOPID Take 600 mg by mouth at bedtime.   KRILL OIL PO Take 1 Dose by mouth daily.   levothyroxine 112 MCG tablet Commonly known as: SYNTHROID Take 112 mcg by mouth daily.   Magnesium Oxide 500 MG Tabs Take 500 mg by mouth at bedtime.   multivitamin tablet Take 1 tablet by mouth daily.   predniSONE 10 MG tablet Commonly known as: DELTASONE Take 10 mg by mouth every evening.   spironolactone-hydrochlorothiazide 25-25 MG tablet Commonly known as: ALDACTAZIDE Take 1 tablet by mouth daily.               Discharge Care Instructions  (From admission, onward)           Start     Ordered   03/28/20 0000  Discharge wound care:       Comments: Begin Dial (antibacterial) soap soaks BID for 30 minutes,Dry digits then replace dressing with non adherent pad over the wounds and dry dressing (wrap or gauze pad) secured with coban. following soaks.   03/28/20 1306           Allergies  Allergen Reactions   Lisinopril Swelling   Sulfa Antibiotics Hives    The results of significant diagnostics from this hospitalization (including imaging, microbiology, ancillary and laboratory) are listed below for reference.    Significant Diagnostic Studies: DG Chest 2 View  Result Date: 03/25/2020 CLINICAL DATA:  Suspected sepsis.  Atrial fibrillation history. EXAM: CHEST - 2 VIEW COMPARISON:  None. FINDINGS: Two lead left chest wall pacemaker. The heart size and mediastinal contours are within normal limits. 4 mm right upper lobe calcified nodule likely  represents of calcified granuloma. Suggestion of a retrocardiac opacity on the frontal view not visualized on lateral view - likely due to overlying soft tissues. No focal consolidation. No pulmonary edema. No pleural effusion. No pneumothorax. No acute osseous abnormality. Multilevel degenerative changes of the spine. L1 through L3 compression fractures with kyphoplasty at the L1 level. Surgical clips in the upper abdomen. IMPRESSION: 1. No active cardiopulmonary disease. 2. L1 through L3 compression fractures -likely chronic. Electronically Signed   By: Iven Finn M.D.   On: 03/25/2020 22:57   CT HEAD WO CONTRAST  Result Date: 03/26/2020 CLINICAL DATA:  Head trauma EXAM: CT HEAD WITHOUT CONTRAST TECHNIQUE: Contiguous axial  images were obtained from the base of the skull through the vertex without intravenous contrast. COMPARISON:  None. FINDINGS: Brain: No evidence of acute infarction, hemorrhage, hydrocephalus, extra-axial collection or mass lesion/mass effect. Cerebral atrophy. Low-attenuation changes in the deep white matter consistent small vessel ischemia. Vascular: Moderate intracranial vascular calcifications. Skull: Calvarium appears intact. No acute depressed skull fractures. Sinuses/Orbits: Mucosal thickening throughout the paranasal sinuses. Near complete opacification of the sphenoid sinus. No acute air-fluid levels. Mastoid air cells are clear. Other: None. IMPRESSION: 1. No acute intracranial abnormalities. 2. Chronic atrophy and small vessel ischemia. 3. Chronic inflammatory changes of the paranasal sinuses. Electronically Signed   By: Lucienne Capers M.D.   On: 03/26/2020 03:47   DG Finger Index Right  Result Date: 03/26/2020 CLINICAL DATA:  Initial evaluation for acute swelling. EXAM: RIGHT INDEX FINGER 2+V COMPARISON:  None. FINDINGS: No acute fracture dislocation. Diffuse soft tissue swelling present about the right index finger. Minimal heterotopic calcification noted adjacent  to the right second middle phalanx. No radiopaque foreign body. No abnormal or dissecting soft tissue emphysema. No evidence for acute osteomyelitis. 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 IMPRESSION: 1. Diffuse soft tissue swelling about the right index finger. No evidence for acute osteomyelitis. 2. No acute fracture or dislocation. 3. 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 Electronically Signed   By: Jeannine Boga M.D.   On: 03/26/2020 01:06   ECHOCARDIOGRAM COMPLETE  Result Date: 03/26/2020    ECHOCARDIOGRAM REPORT   Patient Name:   Carol Brewer Date of Exam: 03/26/2020 Medical Rec #:  [id number]     Height:       66.0 in Accession #:    [id number]    Weight:       160.0 lb Date of Birth:  08-09-1934     BSA:          1.819 m Patient Age:    17 years      BP:           123/65 mmHg Patient Gender: F             HR:           71 bpm. Exam Location:  Inpatient Procedure: 2D Echo, Cardiac Doppler and Color Doppler Indications:    Syncope  History:        Patient has no prior history of Echocardiogram examinations.                 Pacemaker, Arrythmias:Atrial Fibrillation; Risk                 Factors:Hypertension and Dyslipidemia.  Sonographer:    Clayton Lefort RDCS (AE) Referring Phys: [id number] Sherryll Burger [facility]  Sonographer Comments: Technically challenging study due to limited acoustic windows, suboptimal parasternal window, suboptimal apical window and suboptimal subcostal window. IMPRESSIONS  1. Left ventricular ejection fraction, by estimation, is 60 to 65%. The left ventricle has normal function. The left ventricle has no regional wall motion abnormalities. There is mild left ventricular hypertrophy of the basal-septal segment. Left ventricular diastolic parameters are consistent with Grade I diastolic dysfunction (impaired relaxation).  2. Right ventricular systolic function is normal. The right  ventricular size is normal.  3. The mitral valve is normal in structure. No evidence of mitral valve regurgitation.  4. The aortic valve is tricuspid. Aortic valve regurgitation is trivial. Mild aortic valve sclerosis is present, with no evidence of aortic valve stenosis.  5. There is borderline dilatation of the ascending aorta, measuring 37 mm. There is Moderate (Grade III) protruding plaque involving the transverse aorta.  6. The inferior vena cava is normal in size with greater than 50% respiratory variability, suggesting right atrial pressure of 3 mmHg. FINDINGS  Left Ventricle: Left ventricular ejection fraction, by estimation, is 60 to 65%. The left ventricle has normal function. The left ventricle has no regional wall motion abnormalities. The left ventricular internal cavity size was normal in size. There is  mild left ventricular hypertrophy of the basal-septal segment. Left ventricular diastolic parameters are consistent with Grade I diastolic dysfunction (impaired relaxation). Normal left ventricular filling pressure. Right Ventricle: The right ventricular size is normal. No increase in right ventricular wall thickness. Right ventricular systolic function is normal. Left Atrium: Left atrial size was normal in size. Right Atrium: Right atrial size was normal in size. Pericardium: Trivial pericardial effusion is present. Mitral Valve: The mitral valve is normal in structure. No evidence of mitral valve regurgitation. Tricuspid Valve: The tricuspid valve is normal in structure. Tricuspid valve regurgitation is trivial. Aortic Valve: The aortic valve is tricuspid. Aortic valve regurgitation is trivial. Aortic regurgitation PHT measures 713 msec. Mild aortic valve sclerosis is present, with no evidence of aortic valve stenosis. Aortic valve mean gradient measures 4.0 mmHg. Aortic valve peak gradient measures 5.9 mmHg. Aortic valve area, by VTI measures 3.24 cm. Pulmonic Valve: The pulmonic valve was grossly  normal. Pulmonic valve regurgitation is not visualized. Aorta: The aortic root is normal in size and structure. There is borderline dilatation of the ascending aorta, measuring 37 mm. There is moderate (Grade III) protruding plaque involving the transverse aorta. Venous: The inferior vena cava is normal in size with greater than 50% respiratory variability, suggesting right atrial pressure of 3 mmHg. IAS/Shunts: No atrial level shunt detected by color flow Doppler. Additional Comments: A pacer wire is visualized.  LEFT VENTRICLE PLAX 2D LVIDd:         3.90 cm  Diastology LVIDs:         2.60 cm  LV e' medial:    4.00 cm/s LV PW:         1.15 cm  LV E/e' medial:  8.8 LV IVS:        1.50 cm  LV e' lateral:   7.00 cm/s LVOT diam:     2.00 cm  LV E/e' lateral: 5.0 LV SV:         63 LV SV Index:   34 LVOT Area:     3.14 cm  RIGHT VENTRICLE RV S prime:     12.60 cm/s TAPSE (M-mode): 1.6 cm LEFT ATRIUM             Index       RIGHT ATRIUM           Index LA diam:        3.20 cm 1.76 cm/m  RA Area:     11.10 cm LA Vol (A2C):   38.6 ml 21.22 ml/m RA Volume:   21.50 ml  11.82 ml/m LA Vol (A4C):   36.2 ml 19.90 ml/m LA Biplane Vol: 39.8 ml 21.88 ml/m  AORTIC VALVE AV Area (Vmax):    3.09 cm AV Area (Vmean):   2.78 cm AV Area (VTI):     3.24 cm AV Vmax:           121.00 cm/s AV Vmean:          90.400 cm/s  AV VTI:            0.193 m AV Peak Grad:      5.9 mmHg AV Mean Grad:      4.0 mmHg LVOT Vmax:         119.00 cm/s LVOT Vmean:        80.000 cm/s LVOT VTI:          0.199 m LVOT/AV VTI ratio: 1.03 AI PHT:            713 msec  AORTA Ao Root diam: 3.00 cm Ao Asc diam:  3.70 cm MITRAL VALVE               TRICUSPID VALVE MV Area (PHT): 2.05 cm    TR Peak grad:   17.1 mmHg MV Decel Time: 370 msec    TR Vmax:        207.00 cm/s MV E velocity: 35.20 cm/s MV A velocity: 72.84 cm/s  SHUNTS MV E/A ratio:  0.48        Systemic VTI:  0.20 m                            Systemic Diam: 2.00 cm Dani Gobble Croitoru MD Electronically signed  by Sanda Klein MD Signature Date/Time: 03/26/2020/10:45:13 AM    Final     Microbiology: Recent Results (from the past 240 hour(s))  Culture, blood (Routine x 2)     Status: None (Preliminary result)   Collection Time: 03/25/20 10:15 PM   Specimen: BLOOD LEFT ARM  Result Value Ref Range Status   Specimen Description BLOOD LEFT ARM  Final   Special Requests   Final    BOTTLES DRAWN AEROBIC AND ANAEROBIC Blood Culture results may not be optimal due to an excessive volume of blood received in culture bottles   Culture   Final    NO GROWTH 3 DAYS Performed at [facility], [place] [address] [place], [place] [address]    Report Status PENDING  Incomplete  Culture, blood (Routine x 2)     Status: None (Preliminary result)   Collection Time: 03/25/20 10:33 PM   Specimen: BLOOD RIGHT ARM  Result Value Ref Range Status   Specimen Description BLOOD RIGHT ARM  Final   Special Requests   Final    BOTTLES DRAWN AEROBIC ONLY Blood Culture adequate volume   Culture   Final    NO GROWTH 3 DAYS Performed at [facility], [place] [address] [place], [place] [address]    Report Status PENDING  Incomplete  Resp Panel by RT-PCR (Flu A&B, Covid) Nasopharyngeal Swab     Status: None   Collection Time: 03/26/20 12:54 AM   Specimen: Nasopharyngeal Swab; Nasopharyngeal(NP) swabs in vial transport medium  Result Value Ref Range Status   SARS Coronavirus 2 by RT PCR NEGATIVE NEGATIVE Final    Comment: (NOTE) SARS-CoV-2 target nucleic acids are NOT DETECTED.  The SARS-CoV-2 RNA is generally detectable in upper respiratory specimens during the acute phase of infection. The lowest concentration of SARS-CoV-2 viral copies this assay can detect is 138 copies/mL. A negative result does not preclude SARS-Cov-2 infection and should not be used as the sole basis for treatment or other patient management decisions. A negative result may occur with  improper specimen collection/handling, submission of  specimen other than nasopharyngeal swab, presence of viral mutation(s) within the areas targeted by this assay, and inadequate number of viral  copies(<138 copies/mL). A negative result must be combined with clinical observations, patient history, and epidemiological information. The expected result is Negative.  Fact Sheet for Patients:  EntrepreneurPulse.com.au  Fact Sheet for Healthcare Providers:  IncredibleEmployment.be  This test is no t yet approved or cleared by the [place] FDA and  has been authorized for detection and/or diagnosis of SARS-CoV-2 by FDA under an Emergency Use Authorization (EUA). This EUA will remain  in effect (meaning this test can be used) for the duration of the COVID-19 declaration under Section 564(b)(1) of the Act, 21 U.S.C.section 360bbb-3(b)(1), unless the authorization is terminated  or revoked sooner.       Influenza A by PCR NEGATIVE NEGATIVE Final   Influenza B by PCR NEGATIVE NEGATIVE Final    Comment: (NOTE) The Xpert Xpress SARS-CoV-2/FLU/RSV plus assay is intended as an aid in the diagnosis of influenza from Nasopharyngeal swab specimens and should not be used as a sole basis for treatment. Nasal washings and aspirates are unacceptable for Xpert Xpress SARS-CoV-2/FLU/RSV testing.  Fact Sheet for Patients: EntrepreneurPulse.com.au  Fact Sheet for Healthcare Providers: IncredibleEmployment.be  This test is not yet approved or cleared by the [place] FDA and has been authorized for detection and/or diagnosis of SARS-CoV-2 by FDA under an Emergency Use Authorization (EUA). This EUA will remain in effect (meaning this test can be used) for the duration of the COVID-19 declaration under Section 564(b)(1) of the Act, 21 U.S.C. section 360bbb-3(b)(1), unless the authorization is terminated or revoked.  Performed at [facility], [place] [address],  [place], [place] [address]   MRSA PCR Screening     Status: None   Collection Time: 03/26/20  6:11 AM   Specimen: Nasal Mucosa; Nasopharyngeal  Result Value Ref Range Status   MRSA by PCR NEGATIVE NEGATIVE Final    Comment:        The GeneXpert MRSA Assay (FDA approved for NASAL specimens only), is one component of a comprehensive MRSA colonization surveillance program. It is not intended to diagnose MRSA infection nor to guide or monitor treatment for MRSA infections. Performed at [facility], [place] [address] [place], [place] [address]   Fungus Culture With Stain     Status: None (Preliminary result)   Collection Time: 03/26/20 11:45 AM   Specimen: PATH Other; Body Fluid  Result Value Ref Range Status   Fungus Stain Final report  Final    Comment: (NOTE) Performed At: [facility] [place], [place] [id number] Rush Farmer MD AJ:[id number]    Fungus (Mycology) Culture PENDING  Incomplete   Fungal Source ABSCESS  Final    Comment: RIGHT INDEX FINGER Performed at [facility], [place] [address] [place], [place] [address]   Aerobic/Anaerobic Culture (surgical/deep wound)     Status: None (Preliminary result)   Collection Time: 03/26/20 11:45 AM   Specimen: PATH Other; Body Fluid  Result Value Ref Range Status   Specimen Description ABSCESS  Final   Special Requests RIGHT INDEX FINGER ABSCESS SPEC A  Final   Gram Stain   Final    MODERATE WBC PRESENT, PREDOMINANTLY PMN FEW GRAM NEGATIVE RODS    Culture   Final    NORMAL SKIN FLORA Performed at [facility], [place] [address], [place], [place] [address]    Report Status PENDING  Incomplete  Fungus Culture Result     Status: None   Collection Time: 03/26/20 11:45 AM  Result Value Ref Range Status  Result 1 Comment  Final    Comment: (NOTE) KOH/Calcofluor preparation:  no fungus observed. Performed At: [facility] [place], [place] [id number] Rush Farmer MD  LJ:[id number]      Labs: Basic Metabolic Panel: Recent Labs  Lab 03/25/20 2236 03/26/20 0534 03/28/20 0344  NA 135 135 130*  K 4.2 3.8 3.8  CL 99 101 100  CO2 21* 21* 20*  GLUCOSE 104* 86 122*  BUN 58* 53* 60*  CREATININE 2.06* 1.89* 1.69*  CALCIUM 11.3* 10.4* 8.8*  MG  --  2.1  --    Liver Function Tests: Recent Labs  Lab 03/25/20 2236 03/26/20 0534 03/28/20 0344  AST 28 23 18   ALT 19 17 8   ALKPHOS 75 73 55  BILITOT 0.7 0.9 0.5  PROT 7.1 6.2* 5.5*  ALBUMIN 3.4* 2.9* 2.5*   No results for input(s): LIPASE, AMYLASE in the last 168 hours. No results for input(s): AMMONIA in the last 168 hours. CBC: Recent Labs  Lab 03/25/20 2236 03/26/20 0534 03/28/20 0344  WBC 11.2* 10.4 10.2  NEUTROABS 8.0* 8.1* 7.8*  HGB 12.9 12.1 10.3*  HCT 39.8 37.0 31.2*  MCV 95.7 95.6 94.3  PLT 307 261 256   Cardiac Enzymes: No results for input(s): CKTOTAL, CKMB, CKMBINDEX, TROPONINI in the last 168 hours. BNP: BNP (last 3 results) No results for input(s): BNP in the last 8760 hours.  ProBNP (last 3 results) No results for input(s): PROBNP in the last 8760 hours.  CBG: No results for input(s): GLUCAP in the last 168 hours.  Principal Problem:   Cellulitis of index finger, right Active Problems:   AF (paroxysmal atrial fibrillation) ([place])   Essential hypertension   Hypothyroidism   Mixed hyperlipidemia   Vasculitis ([place])   Syncope   Fall at home, initial encounter   Cellulitis and abscess of hand   Time coordinating discharge: 38 minutes.  Signed:        Winfred Redel, DO [facility]  03/28/2020, 5:37 PM

## 2020-03-28 NOTE — TOC Initial Note (Signed)
Transition of Care [facility] - Initial/Assessment Note    Patient Details  Name: Carol Brewer MRN: [id number] Date of Birth: January 23, 1935  Transition of Care [facility]) CM/SW Contact:    Marilu Favre, RN Phone Number: 03/28/2020, 12:48 PM  Clinical Narrative:                  Patient from home with husband . Has son.   Discussed home health PT/OT/RN . Patient in agreement.   No perference. Referral given to [facility] with [facility] and accepted. Patient has walker and cane at home already Expected Discharge Plan: [place] Barriers to Discharge: Continued Medical Work up   Patient Goals and CMS Choice Patient states their goals for this hospitalization and ongoing recovery are:: to return to home CMS Medicare.gov Compare Post Acute Care list provided to:: Patient Choice offered to / list presented to : Patient  Expected Discharge Plan and Services Expected Discharge Plan: [place] Choice: [place] arrangements for the past 2 months: Single Family Home                 DME Arranged: N/A DME Agency: NA       HH Arranged: RN, PT, OT HH Agency: [place] Date [place]: 03/28/20 Time HH Agency Contacted: 67 Representative spoke with at [place]: Sharmon Revere  Prior Living Arrangements/Services Living arrangements for the past 2 months: [place] with:: Spouse Patient language and need for interpreter reviewed:: Yes Do you feel safe going back to the place where you live?: Yes      Need for Family Participation in Patient Care: Yes (Comment) Care giver support system in place?: Yes (comment) Current home services: DME Criminal Activity/Legal Involvement Pertinent to Current Situation/Hospitalization: No - Comment as needed  Activities of Daily Living Home Assistive Devices/Equipment: Cane (specify quad or straight), Walker (specify type) ADL Screening  (condition at time of admission) Patient's cognitive ability adequate to safely complete daily activities?: Yes Is the patient deaf or have difficulty hearing?: No Does the patient have difficulty seeing, even when wearing glasses/contacts?: No Does the patient have difficulty concentrating, remembering, or making decisions?: No Patient able to express need for assistance with ADLs?: Yes Does the patient have difficulty dressing or bathing?: No Independently performs ADLs?: Yes (appropriate for developmental age) Does the patient have difficulty walking or climbing stairs?: Yes Weakness of Legs: Both Weakness of Arms/Hands: None  Permission Sought/Granted   Permission granted to share information with : No              Emotional Assessment Appearance:: Appears stated age Attitude/Demeanor/Rapport: Engaged Affect (typically observed): Accepting Orientation: : Oriented to Self, Oriented to Place, Oriented to  Time, Oriented to Situation Alcohol / Substance Use: Not Applicable Psych Involvement: No (comment)  Admission diagnosis:  Hypercalcemia [E83.52] Renal insufficiency [N28.9] Tenosynovitis of finger [M65.9] Cellulitis of finger of right hand [P91.505] Elevated blood pressure reading with diagnosis of hypertension [I10] Cellulitis and abscess of hand [L03.119, L02.519] Patient Active Problem List   Diagnosis Date Noted  . AF (paroxysmal atrial fibrillation) ([place]) 03/26/2020  . Essential hypertension 03/26/2020  . History of pacemaker 03/26/2020  . Hypothyroidism 03/26/2020  . Mixed hyperlipidemia 03/26/2020  . Vasculitis ([place]) 03/26/2020  . Cellulitis of index finger, right 03/26/2020  . Syncope 03/26/2020  . Fall at home, initial encounter 03/26/2020  . Cellulitis and abscess of hand  03/26/2020   PCP:  Jacqualine Code, DO Pharmacy:   [facility] [place] [address] Phone:  [phone] Fax: [place], [place] [address] Phone: [phone] Fax: [phone]     Social Determinants of Health (SDOH) Interventions    Readmission Risk Interventions No flowsheet data found.

## 2020-03-30 LAB — CULTURE, BLOOD (ROUTINE X 2)
Culture: NO GROWTH
Culture: NO GROWTH
Special Requests: ADEQUATE

## 2020-03-31 LAB — AEROBIC/ANAEROBIC CULTURE W GRAM STAIN (SURGICAL/DEEP WOUND): Culture: NORMAL

## 2020-04-12 ENCOUNTER — Other Ambulatory Visit: Payer: Self-pay

## 2020-04-12 ENCOUNTER — Ambulatory Visit (INDEPENDENT_AMBULATORY_CARE_PROVIDER_SITE_OTHER): Payer: Medicare Other | Admitting: Infectious Diseases

## 2020-04-12 ENCOUNTER — Encounter: Payer: Self-pay | Admitting: Infectious Diseases

## 2020-04-12 VITALS — BP 163/93 | HR 90 | Temp 98.0°F | Ht 66.0 in | Wt 156.0 lb

## 2020-04-12 DIAGNOSIS — L03119 Cellulitis of unspecified part of limb: Secondary | ICD-10-CM

## 2020-04-12 DIAGNOSIS — L02519 Cutaneous abscess of unspecified hand: Secondary | ICD-10-CM | POA: Diagnosis not present

## 2020-04-12 DIAGNOSIS — L03011 Cellulitis of right finger: Secondary | ICD-10-CM | POA: Diagnosis present

## 2020-04-12 NOTE — Progress Notes (Signed)
Patient: Carol Brewer  DOB: [birth year] MRN: [id number] PCP: Jacqualine Code, DO    Subjective:  CC:  Carol Brewer is a 85 y.o. female here for hospital follow up of hand infection involving her right index finger.     HPI:  [name] was hospitalized at [facility] 11/27 - 11/30 following surgical debridement of her right index finger. The swelling occurred fairly suddenly after she noted some mild pain that started 5 days prior to ER visit. No known injuries that she can recall - denies any animal or human bites, no trauma. She did fall at home and injure herself more than she expected regarding her head/eye and knees, but this was after her finger started hurting. She does acknowledge she "was not feeling well" the whole week prior to hospital care.   Dr. Stann [place] from orthopedics was consulted for debridement and took her to surgery on 03/26/2020 to drain right index finger abscess. She had one are of purulence near the distal phalanx with further indurated fluctuant tissue along the middle phalanx. No bone seemed to be involved. Intraoperative gram stain with gram negative rods initially but only normal skin flora. She was discharged home with oral augmentin bid.   She is here today with her family. Since discharge she has done well with her antibiotics and completed them on Monday.  Has had follow up with hand surgery team and reported to be doing well. Has another follow up in 3 weeks. Some redness noted at times and flaking skin. Range of motion of her finger has improved and she can curl it back into a fist easily. Still has some scabs on it. Afraid to put lotion on it. Wondering if she needs to keep it wrapped/covered.   She continues on prednisone 10 mg QD for vasculitis history.     Review of Systems  Constitutional: Negative for chills and fever.  HENT: Negative for tinnitus.   Eyes: Negative for blurred vision and photophobia.  Respiratory: Negative for cough and sputum  production.   Cardiovascular: Negative for chest pain.  Gastrointestinal: Negative for diarrhea, nausea and vomiting.  Genitourinary: Negative for dysuria.  Skin: Negative for rash.  Neurological: Negative for headaches.    Past Medical History:  Diagnosis Date  . CHF (congestive heart failure) ([place])   . Essential hypertension 03/26/2020  . History of pacemaker 03/26/2020  . Hypertension   . Hypothyroidism 03/26/2020  . Mixed hyperlipidemia 03/26/2020  . Vasculitis ([place]) 03/26/2020    Outpatient Medications Prior to Visit  Medication Sig Dispense Refill  . amiodarone (PACERONE) 100 MG tablet Take 0.5 tablets (50 mg total) by mouth daily. 30 tablet 0  . amLODipine (NORVASC) 5 MG tablet Take 5 mg by mouth daily.    Mar[place] Kitchen aspirin 325 MG tablet Take 162.5 mg by mouth daily.     . cetirizine (ZYRTEC) 10 MG tablet Take 10 mg by mouth daily as needed for allergies.    Mar[place] Kitchen docusate sodium (COLACE) 100 MG capsule Take 1 capsule (100 mg total) by mouth 2 (two) times daily. 10 capsule 0  . feeding supplement (ENSURE ENLIVE / ENSURE PLUS) LIQD Take 237 mLs by mouth 2 (two) times daily between meals. 237 mL 12  . gemfibrozil (LOPID) 600 MG tablet Take 600 mg by mouth at bedtime.    Marland Kitchen KRILL OIL PO Take 1 Dose by mouth daily.    Mar[place] Kitchen levothyroxine (SYNTHROID) 112 MCG tablet Take 112 mcg by mouth daily.    . Magnesium  Oxide 500 MG TABS Take 500 mg by mouth at bedtime.    . Multiple Vitamin (MULTIVITAMIN) tablet Take 1 tablet by mouth daily.    . Omega-3 Fatty Acids (FISH OIL PO) Take 1 Dose by mouth daily.    . predniSONE (DELTASONE) 10 MG tablet Take 10 mg by mouth every evening.    Mar[place] Kitchen spironolactone-hydrochlorothiazide (ALDACTAZIDE) 25-25 MG tablet Take 1 tablet by mouth daily.     No facility-administered medications prior to visit.     Allergies  Allergen Reactions  . Lisinopril Swelling  . Sulfa Antibiotics Hives    Social History   Tobacco Use  . Smoking status: Never Smoker  . Smokeless  tobacco: Never Used  Vaping Use  . Vaping Use: Never used  Substance Use Topics  . Alcohol use: Never  . Drug use: Never    Family History  Problem Relation Age of Onset  . Diabetes Mother   . Bone cancer Father     Objective:   Vitals:   04/12/20 1524  BP: (!) 163/93  Pulse: 90  Temp: 98 F (36.7 C)  Weight: 156 lb (70.8 kg)  Height: 5\' 6"  (1.676 m)   Body mass index is 25.18 kg/m.  Physical Exam Constitutional:      Appearance: Normal appearance. She is not ill-appearing.  HENT:     Mouth/Throat:     Mouth: Mucous membranes are moist.     Pharynx: Oropharynx is clear.  Eyes:     General: No scleral icterus. Pulmonary:     Effort: Pulmonary effort is normal.  Skin:    Comments: R index finger with scabbed over incision. There is some dull redness (more circulatory) but no warmth, fluctuance or drainage. ROM is intact with full flexion and extension actively demonstrated. Still some residual swelling.   Neurological:     Mental Status: She is oriented to person, place, and time.  Psychiatric:        Mood and Affect: Mood normal.        Thought Content: Thought content normal.     Lab Results: Lab Results  Component Value Date   WBC 10.2 03/28/2020   HGB 10.3 (L) 03/28/2020   HCT 31.2 (L) 03/28/2020   MCV 94.3 03/28/2020   PLT 256 03/28/2020    Lab Results  Component Value Date   CREATININE 1.69 (H) 03/28/2020   BUN 60 (H) 03/28/2020   NA 130 (L) 03/28/2020   K 3.8 03/28/2020   CL 100 03/28/2020   CO2 20 (L) 03/28/2020    Lab Results  Component Value Date   ALT 8 03/28/2020   AST 18 03/28/2020   ALKPHOS 55 03/28/2020   BILITOT 0.5 03/28/2020     Assessment & Plan:   Problem List Items Addressed This Visit      Unprioritized   RESOLVED: Cellulitis of index finger, right - Primary   Cellulitis and abscess of hand    Doing well following 14-day treatment of soft tissue abscess involving her finger. Cultures were only revealing for normal  skin flora but had shown GNR on initial gram stain. No findings today that would warrant extension of antibiotics and appears to be well treated. Unclear as to what the cause of her infection was - her husband mentioned a possible sliver of metal that she had in her finger recently? Given her chronic prednisone use her risk for infection is higher.   Will have her follow up PRN for now - she will meet  with hand surgery again in a few weeks.  Incision / dressing care and suggestions per his expertise.           Janene Madeira, MSN, NP-C [facility] [place] Pager: [phone] Office: [phone]  04/12/20  4:54 PM

## 2020-04-12 NOTE — Assessment & Plan Note (Addendum)
Doing well following 14-day treatment of soft tissue abscess involving her finger. Cultures were only revealing for normal skin flora but had shown GNR on initial gram stain. No findings today that would warrant extension of antibiotics and appears to be well treated. Unclear as to what the cause of her infection was - her husband mentioned a possible sliver of metal that she had in her finger recently? Given her chronic prednisone use her risk for infection is higher.   Will have her follow up PRN for now - she will meet with hand surgery again in a few weeks.  Incision / dressing care and suggestions per his expertise.

## 2020-04-12 NOTE — Patient Instructions (Signed)
No further antibiotics.   Would have you back as needed. Please follow care and instructions from the hand surgeon and follow up with him as scheduled.   Things to call about -  1. Increased redness 2. Drainage  3. Decreased range of motion  4. Increased pain

## 2020-04-25 LAB — FUNGUS CULTURE RESULT

## 2020-04-25 LAB — FUNGUS CULTURE WITH STAIN

## 2020-04-25 LAB — FUNGAL ORGANISM REFLEX

## 2020-06-27 DEATH — deceased

## 2021-12-17 IMAGING — DX DG FINGER INDEX 2+V*R*
1 series · 3 of 3 positions shown · non-contrast
Comparison: None.

CLINICAL DATA: Initial evaluation for acute swelling.

EXAM:
RIGHT INDEX FINGER 2+V

[Series 1: finger · 0.14mm/px · 3 of 3 slices shown]
[im 1/3]
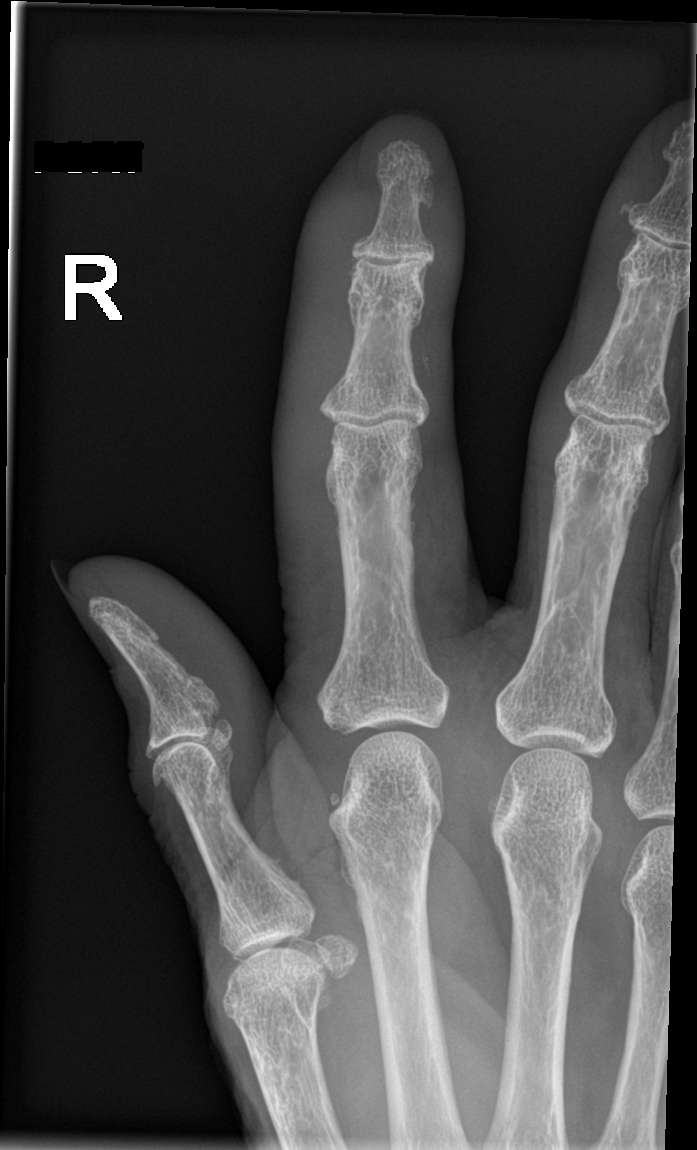
[im 2/3]
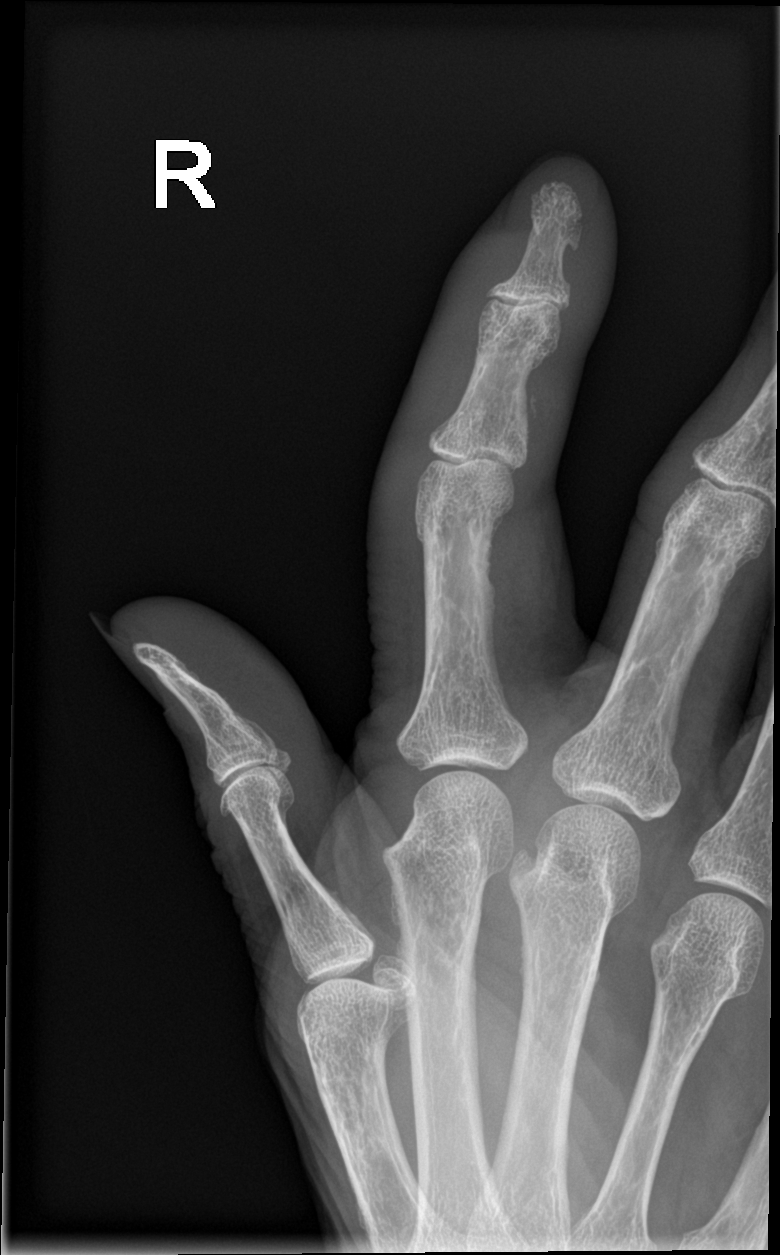
[im 3/3]
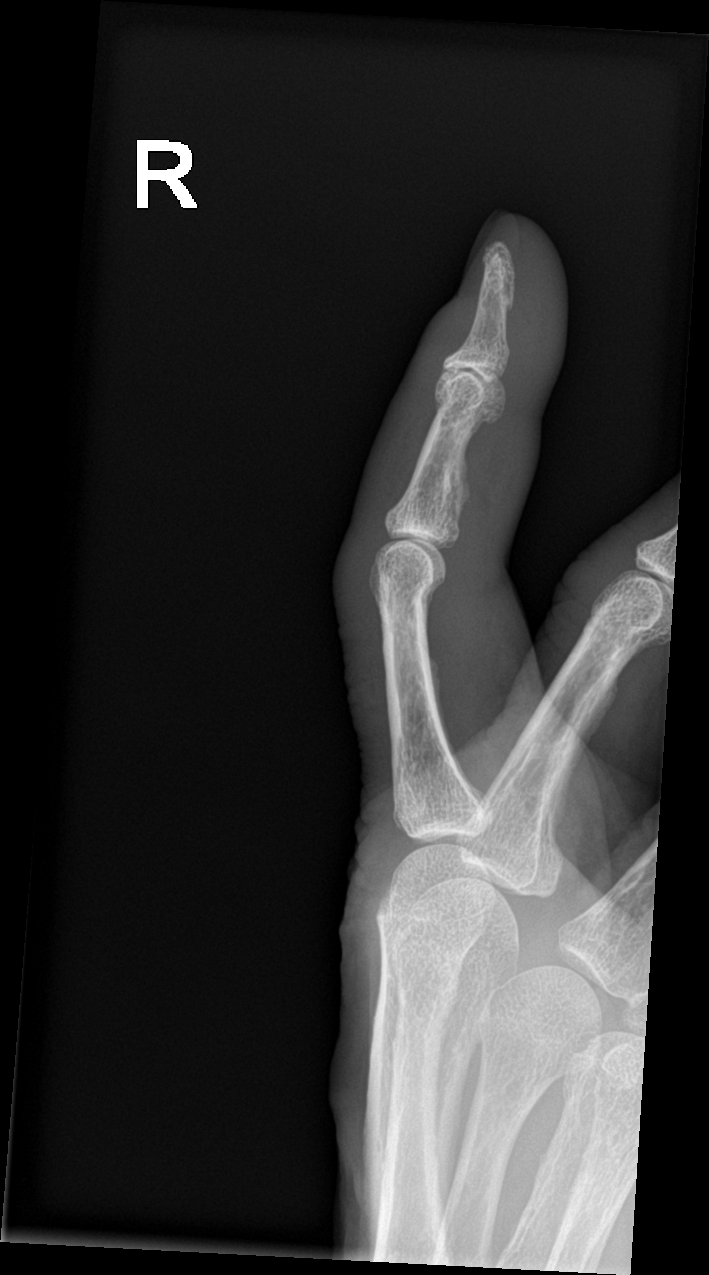

[3 of 3 positions shown; findings below may reference images not displayed]

FINDINGS: No acute fracture dislocation. Diffuse soft tissue swelling present
about the right index finger. Minimal heterotopic calcification
noted adjacent to the right second middle phalanx. No radiopaque
foreign body. No abnormal or dissecting soft tissue emphysema. No
evidence for acute osteomyelitis. Underlying moderate osteoarthritic
changes about the right index finger and visualized hand.
IMPRESSION: 1. Diffuse soft tissue swelling about the right index finger. No
evidence for acute osteomyelitis.
2. No acute fracture or dislocation.
3. Moderate osteoarthritic changes about the right index finger and
visualized hand.

## 2021-12-17 IMAGING — CT CT HEAD W/O CM
4 series · 16 of 47 positions shown, 18 images · non-contrast
Comparison: None.

CLINICAL DATA: Head trauma

EXAM:
CT HEAD WITHOUT CONTRAST
TECHNIQUE: Contiguous axial images were obtained from the base of the skull
through the vertex without intravenous contrast.

[Series 3: head wo · axial · 0.40mm/px · z∈[-198,-78]mm · 7 of 34 slices shown, 9 images]
[im 5/34  brain]
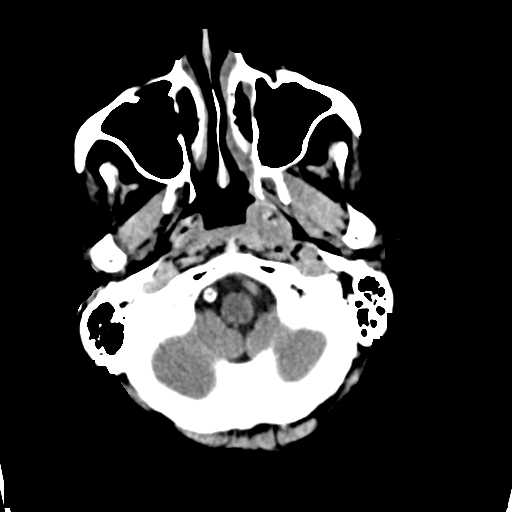
[im 5/34  bone]
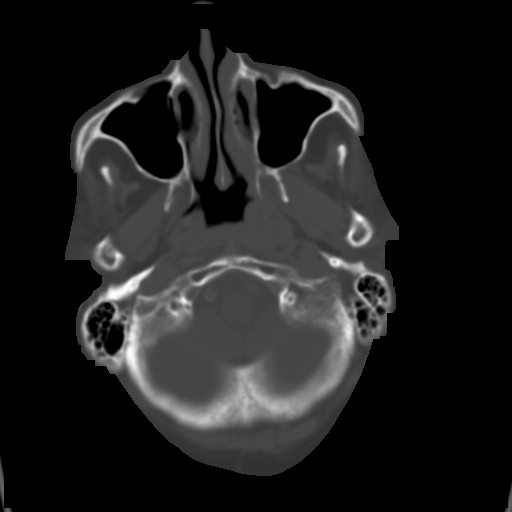
[im 9/34  brain]
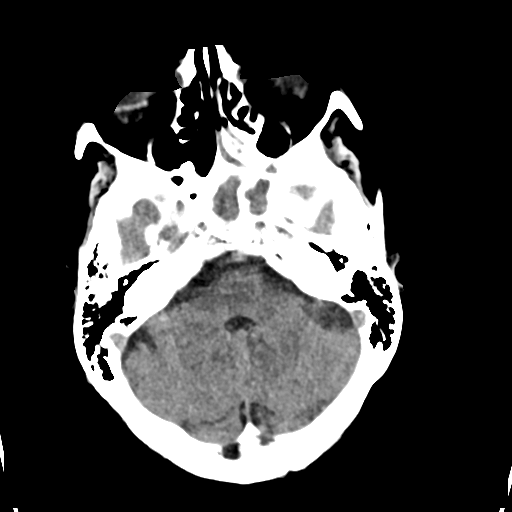
[im 13/34  brain]
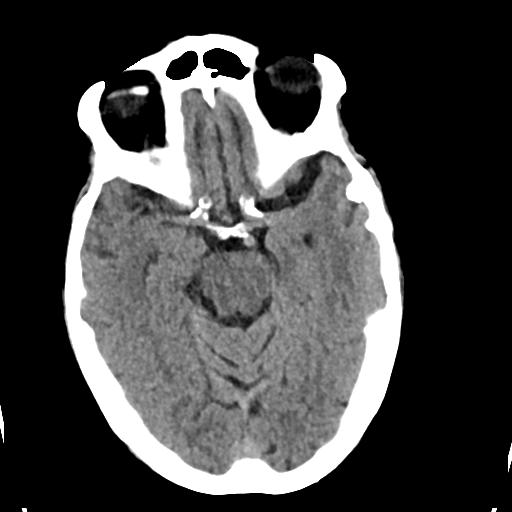
[im 17/34  brain]
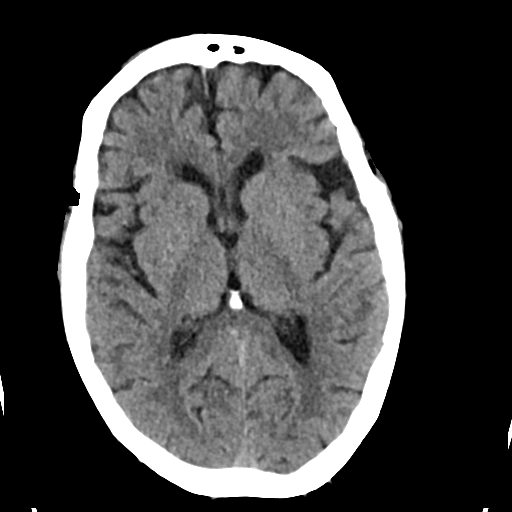
[im 21/34  brain]
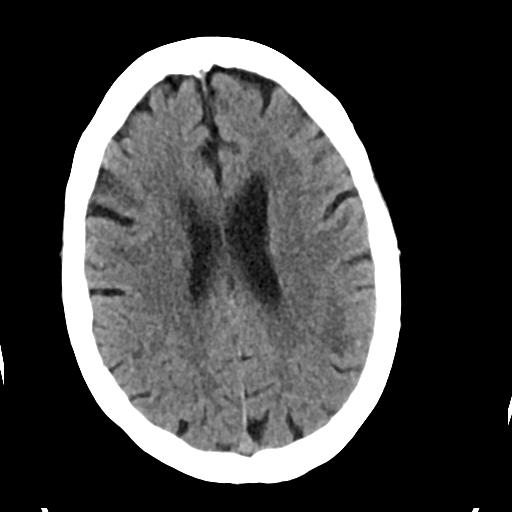
[im 21/34  bone]
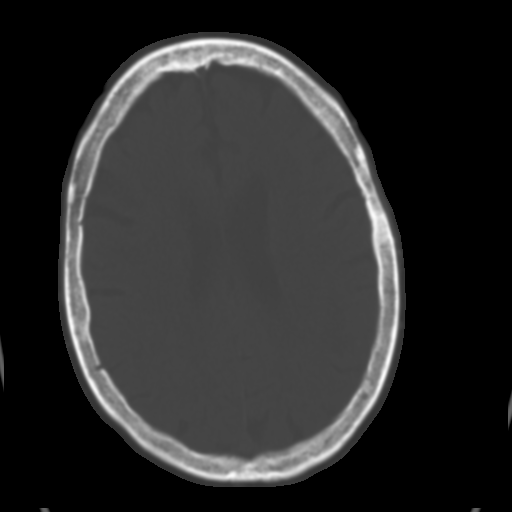
[im 25/34  brain]
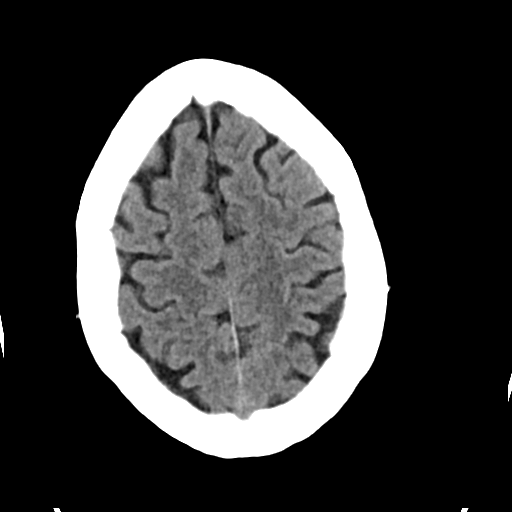
[im 29/34  brain]
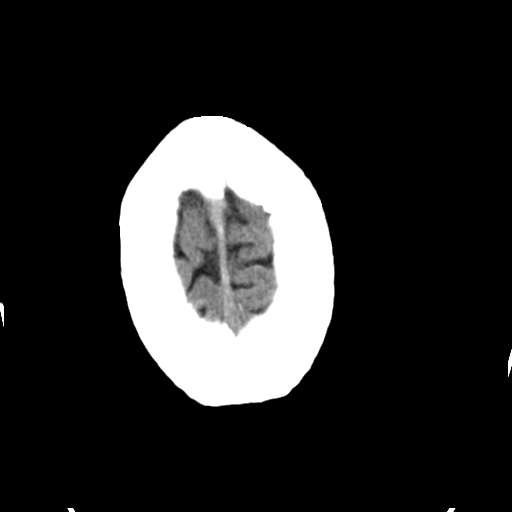

[Series 4: head bone · axial · 0.40mm/px · z∈[-202,-170]mm · 3 of 84 slices shown]
[im 9/84  bone]
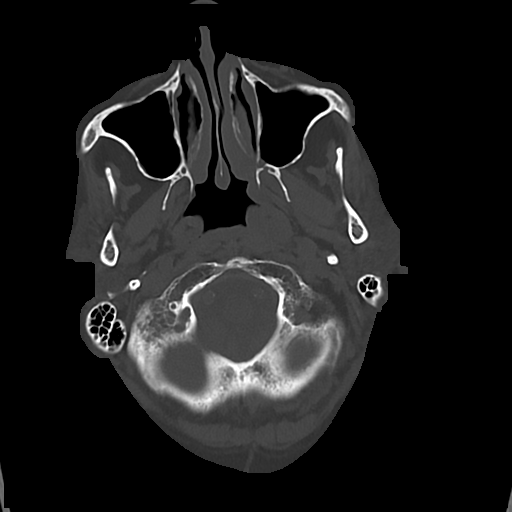
[im 17/84  bone]
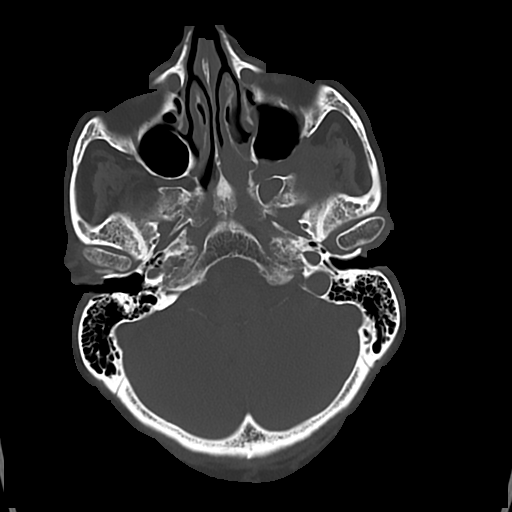
[im 25/84  bone]
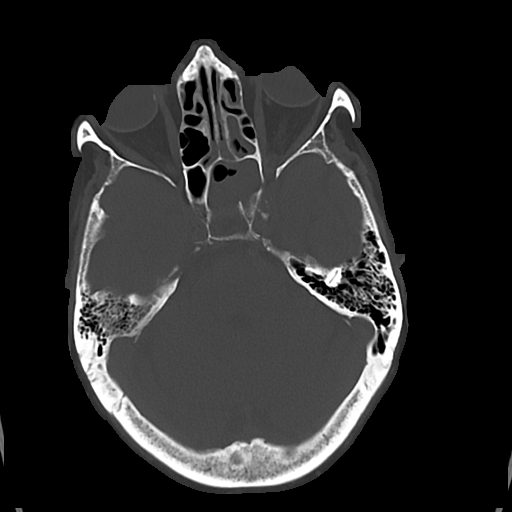

[Series 5: cor soft · coronal · 0.42mm/px · 3 of 82 slices shown]
[im 28/82  brain]
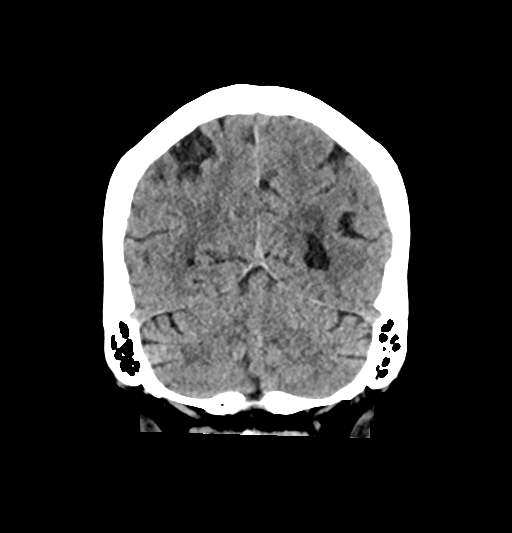
[im 37/82  brain]
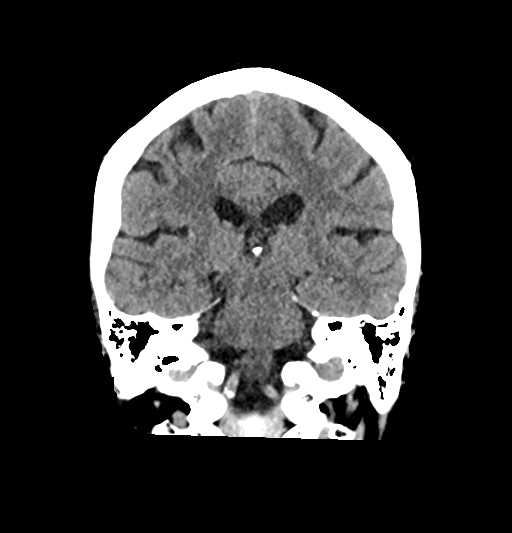
[im 46/82  brain]
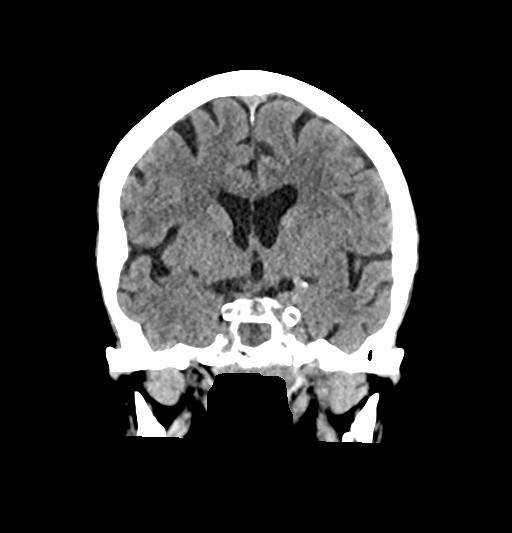

[Series 6: sag soft · sagittal · 0.41mm/px · 3 of 56 slices shown]
[im 19/56  brain]
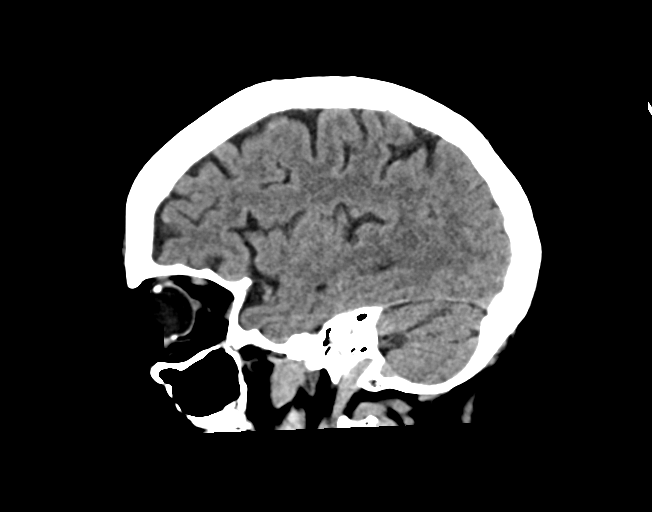
[im 28/56  brain]
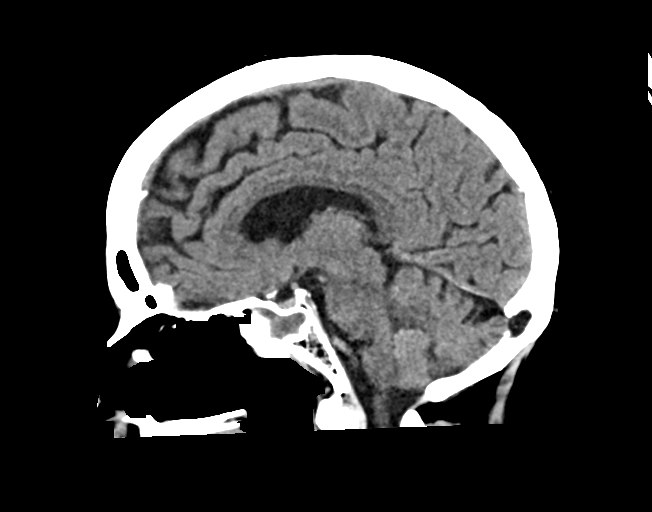
[im 37/56  brain]
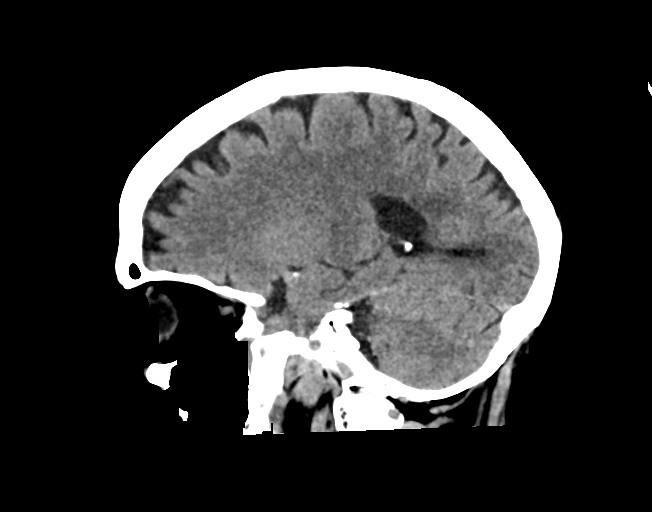

[16 of 47 positions shown; findings below may reference images not displayed]

FINDINGS: Brain: No evidence of acute infarction, hemorrhage, hydrocephalus,
extra-axial collection or mass lesion/mass effect. Cerebral atrophy.
Low-attenuation changes in the deep white matter consistent small
vessel ischemia.

Vascular: Moderate intracranial vascular calcifications.

Skull: Calvarium appears intact. No acute depressed skull fractures.

Sinuses/Orbits: Mucosal thickening throughout the paranasal sinuses.
Near complete opacification of the sphenoid sinus. No acute
air-fluid levels. Mastoid air cells are clear.

Other: None.
IMPRESSION: 1. No acute intracranial abnormalities.
2. Chronic atrophy and small vessel ischemia.
3. Chronic inflammatory changes of the paranasal sinuses.
